# Patient Record
Sex: Female | Born: 1950 | Hispanic: Refuse to answer | Marital: Married | Smoking: Never smoker
Health system: Southern US, Community
[De-identification: ages and names within clinical notes are randomized; demographics above are authoritative.]

## PROBLEM LIST (undated history)

## (undated) DIAGNOSIS — K635 Polyp of colon: Secondary | ICD-10-CM

## (undated) DIAGNOSIS — R079 Chest pain, unspecified: Secondary | ICD-10-CM

## (undated) DIAGNOSIS — R109 Unspecified abdominal pain: Secondary | ICD-10-CM

## (undated) DIAGNOSIS — F419 Anxiety disorder, unspecified: Secondary | ICD-10-CM

## (undated) DIAGNOSIS — G4733 Obstructive sleep apnea (adult) (pediatric): Secondary | ICD-10-CM

## (undated) DIAGNOSIS — R51 Headache: Secondary | ICD-10-CM

## (undated) DIAGNOSIS — R519 Headache, unspecified: Secondary | ICD-10-CM

## (undated) DIAGNOSIS — M8580 Other specified disorders of bone density and structure, unspecified site: Secondary | ICD-10-CM

## (undated) DIAGNOSIS — K219 Gastro-esophageal reflux disease without esophagitis: Secondary | ICD-10-CM

## (undated) DIAGNOSIS — R42 Dizziness and giddiness: Secondary | ICD-10-CM

## (undated) DIAGNOSIS — E785 Hyperlipidemia, unspecified: Secondary | ICD-10-CM

## (undated) DIAGNOSIS — M797 Fibromyalgia: Secondary | ICD-10-CM

## (undated) HISTORY — DX: Headache, unspecified: R51.9

## (undated) HISTORY — PX: SHOULDER ARTHROSCOPY W/ ROTATOR CUFF REPAIR: SHX2400

## (undated) HISTORY — DX: Unspecified abdominal pain: R10.9

## (undated) HISTORY — DX: Dizziness and giddiness: R42

## (undated) HISTORY — DX: Headache: R51

## (undated) HISTORY — DX: Obstructive sleep apnea (adult) (pediatric): G47.33

## (undated) HISTORY — DX: Other specified disorders of bone density and structure, unspecified site: M85.80

## (undated) HISTORY — DX: Anxiety disorder, unspecified: F41.9

## (undated) HISTORY — DX: Chest pain, unspecified: R07.9

## (undated) HISTORY — DX: Hyperlipidemia, unspecified: E78.5

## (undated) HISTORY — DX: Polyp of colon: K63.5

---

## 1998-06-03 ENCOUNTER — Emergency Department (HOSPITAL_COMMUNITY): Admission: EM | Admit: 1998-06-03 | Discharge: 1998-06-03 | Payer: Self-pay | Admitting: Emergency Medicine

## 2010-06-25 ENCOUNTER — Other Ambulatory Visit: Admission: RE | Admit: 2010-06-25 | Discharge: 2010-06-25 | Payer: Self-pay | Admitting: Family Medicine

## 2010-07-06 ENCOUNTER — Encounter: Admission: RE | Admit: 2010-07-06 | Discharge: 2010-07-06 | Payer: Self-pay | Admitting: Family Medicine

## 2010-08-30 ENCOUNTER — Ambulatory Visit (HOSPITAL_COMMUNITY): Admission: RE | Admit: 2010-08-30 | Discharge: 2010-08-31 | Payer: Self-pay | Admitting: Specialist

## 2011-01-31 LAB — CBC
HCT: 42 % (ref 36.0–46.0)
Hemoglobin: 14.2 g/dL (ref 12.0–15.0)
MCV: 84.9 fL (ref 78.0–100.0)
RDW: 13.4 % (ref 11.5–15.5)
WBC: 6.3 10*3/uL (ref 4.0–10.5)

## 2011-01-31 LAB — BASIC METABOLIC PANEL
BUN: 8 mg/dL (ref 6–23)
Chloride: 108 mEq/L (ref 96–112)
Potassium: 4.1 mEq/L (ref 3.5–5.1)

## 2011-01-31 LAB — SURGICAL PCR SCREEN: MRSA, PCR: NEGATIVE

## 2011-08-29 ENCOUNTER — Other Ambulatory Visit: Payer: Self-pay | Admitting: Family Medicine

## 2011-08-29 DIAGNOSIS — Z1231 Encounter for screening mammogram for malignant neoplasm of breast: Secondary | ICD-10-CM

## 2011-09-25 ENCOUNTER — Ambulatory Visit
Admission: RE | Admit: 2011-09-25 | Discharge: 2011-09-25 | Disposition: A | Discharge status: Home / Self Care | Payer: 59 | Source: Ambulatory Visit | Attending: Family Medicine | Admitting: Family Medicine

## 2011-09-25 DIAGNOSIS — Z1231 Encounter for screening mammogram for malignant neoplasm of breast: Secondary | ICD-10-CM

## 2012-10-21 ENCOUNTER — Other Ambulatory Visit (HOSPITAL_BASED_OUTPATIENT_CLINIC_OR_DEPARTMENT_OTHER): Payer: Self-pay | Admitting: Family Medicine

## 2012-10-21 DIAGNOSIS — Z1231 Encounter for screening mammogram for malignant neoplasm of breast: Secondary | ICD-10-CM

## 2012-11-16 ENCOUNTER — Ambulatory Visit (HOSPITAL_BASED_OUTPATIENT_CLINIC_OR_DEPARTMENT_OTHER)
Admission: RE | Admit: 2012-11-16 | Discharge: 2012-11-16 | Disposition: A | Discharge status: Home / Self Care | Payer: 59 | Source: Ambulatory Visit | Attending: Family Medicine | Admitting: Family Medicine

## 2012-11-16 DIAGNOSIS — Z1231 Encounter for screening mammogram for malignant neoplasm of breast: Secondary | ICD-10-CM

## 2013-04-13 ENCOUNTER — Other Ambulatory Visit (HOSPITAL_COMMUNITY)
Admission: RE | Admit: 2013-04-13 | Discharge: 2013-04-13 | Disposition: A | Discharge status: Home / Self Care | Payer: 59 | Source: Ambulatory Visit | Attending: Family Medicine | Admitting: Family Medicine

## 2013-04-13 ENCOUNTER — Other Ambulatory Visit: Payer: Self-pay | Admitting: Family Medicine

## 2013-04-13 DIAGNOSIS — Z124 Encounter for screening for malignant neoplasm of cervix: Secondary | ICD-10-CM | POA: Insufficient documentation

## 2013-08-11 ENCOUNTER — Other Ambulatory Visit: Payer: Self-pay | Admitting: Family Medicine

## 2013-08-11 DIAGNOSIS — R109 Unspecified abdominal pain: Secondary | ICD-10-CM

## 2013-08-12 ENCOUNTER — Ambulatory Visit
Admission: RE | Admit: 2013-08-12 | Discharge: 2013-08-12 | Disposition: A | Discharge status: Home / Self Care | Payer: 59 | Source: Ambulatory Visit | Attending: Family Medicine | Admitting: Family Medicine

## 2013-08-12 DIAGNOSIS — R109 Unspecified abdominal pain: Secondary | ICD-10-CM

## 2013-08-13 ENCOUNTER — Other Ambulatory Visit: Payer: Self-pay | Admitting: Family Medicine

## 2013-08-13 DIAGNOSIS — R109 Unspecified abdominal pain: Secondary | ICD-10-CM

## 2013-08-17 ENCOUNTER — Ambulatory Visit
Admission: RE | Admit: 2013-08-17 | Discharge: 2013-08-17 | Disposition: A | Discharge status: Home / Self Care | Payer: 59 | Source: Ambulatory Visit | Attending: Family Medicine | Admitting: Family Medicine

## 2013-08-17 DIAGNOSIS — R109 Unspecified abdominal pain: Secondary | ICD-10-CM

## 2013-08-17 MED ORDER — IOHEXOL 300 MG/ML  SOLN
100.0000 mL | Freq: Once | INTRAMUSCULAR | Status: AC | PRN
  Administered 2013-08-17: 100 mL via INTRAVENOUS

## 2013-08-20 ENCOUNTER — Other Ambulatory Visit: Payer: Self-pay | Admitting: Gastroenterology

## 2013-08-20 DIAGNOSIS — R109 Unspecified abdominal pain: Secondary | ICD-10-CM

## 2013-08-27 ENCOUNTER — Ambulatory Visit
Admission: RE | Admit: 2013-08-27 | Discharge: 2013-08-27 | Disposition: A | Discharge status: Home / Self Care | Payer: 59 | Source: Ambulatory Visit | Attending: Gastroenterology | Admitting: Gastroenterology

## 2013-08-27 DIAGNOSIS — R109 Unspecified abdominal pain: Secondary | ICD-10-CM

## 2013-09-23 ENCOUNTER — Other Ambulatory Visit: Payer: Self-pay | Admitting: Gastroenterology

## 2013-09-23 DIAGNOSIS — M549 Dorsalgia, unspecified: Secondary | ICD-10-CM

## 2013-10-03 ENCOUNTER — Ambulatory Visit
Admission: RE | Admit: 2013-10-03 | Discharge: 2013-10-03 | Disposition: A | Discharge status: Home / Self Care | Payer: 59 | Source: Ambulatory Visit | Attending: Gastroenterology | Admitting: Gastroenterology

## 2013-10-03 DIAGNOSIS — M549 Dorsalgia, unspecified: Secondary | ICD-10-CM

## 2014-04-19 ENCOUNTER — Other Ambulatory Visit (HOSPITAL_BASED_OUTPATIENT_CLINIC_OR_DEPARTMENT_OTHER): Payer: Self-pay | Admitting: Family Medicine

## 2014-04-19 DIAGNOSIS — Z1231 Encounter for screening mammogram for malignant neoplasm of breast: Secondary | ICD-10-CM

## 2014-04-29 ENCOUNTER — Ambulatory Visit (HOSPITAL_BASED_OUTPATIENT_CLINIC_OR_DEPARTMENT_OTHER)
Admission: RE | Admit: 2014-04-29 | Discharge: 2014-04-29 | Disposition: A | Discharge status: Home / Self Care | Payer: 59 | Source: Ambulatory Visit | Attending: Family Medicine | Admitting: Family Medicine

## 2014-04-29 DIAGNOSIS — Z1231 Encounter for screening mammogram for malignant neoplasm of breast: Secondary | ICD-10-CM | POA: Insufficient documentation

## 2014-08-31 ENCOUNTER — Emergency Department (HOSPITAL_BASED_OUTPATIENT_CLINIC_OR_DEPARTMENT_OTHER): Payer: 59

## 2014-08-31 ENCOUNTER — Encounter (HOSPITAL_BASED_OUTPATIENT_CLINIC_OR_DEPARTMENT_OTHER): Payer: Self-pay | Admitting: Emergency Medicine

## 2014-08-31 ENCOUNTER — Emergency Department (HOSPITAL_BASED_OUTPATIENT_CLINIC_OR_DEPARTMENT_OTHER)
Admission: EM | Admit: 2014-08-31 | Discharge: 2014-08-31 | Disposition: A | Discharge status: Home / Self Care | Payer: 59 | Attending: Emergency Medicine | Admitting: Emergency Medicine

## 2014-08-31 DIAGNOSIS — X58XXXA Exposure to other specified factors, initial encounter: Secondary | ICD-10-CM | POA: Insufficient documentation

## 2014-08-31 DIAGNOSIS — Y9389 Activity, other specified: Secondary | ICD-10-CM | POA: Diagnosis not present

## 2014-08-31 DIAGNOSIS — M25562 Pain in left knee: Secondary | ICD-10-CM

## 2014-08-31 DIAGNOSIS — Z79899 Other long term (current) drug therapy: Secondary | ICD-10-CM | POA: Diagnosis not present

## 2014-08-31 DIAGNOSIS — Z8719 Personal history of other diseases of the digestive system: Secondary | ICD-10-CM | POA: Diagnosis not present

## 2014-08-31 DIAGNOSIS — Y9289 Other specified places as the place of occurrence of the external cause: Secondary | ICD-10-CM | POA: Diagnosis not present

## 2014-08-31 DIAGNOSIS — S8992XA Unspecified injury of left lower leg, initial encounter: Secondary | ICD-10-CM | POA: Insufficient documentation

## 2014-08-31 HISTORY — DX: Gastro-esophageal reflux disease without esophagitis: K21.9

## 2014-08-31 HISTORY — DX: Fibromyalgia: M79.7

## 2014-08-31 MED ORDER — OXYCODONE-ACETAMINOPHEN 5-325 MG PO TABS
2.0000 | ORAL_TABLET | Freq: Once | ORAL | Status: AC
  Administered 2014-08-31: 1 via ORAL
  Filled 2014-08-31: qty 2

## 2014-08-31 MED ORDER — OXYCODONE-ACETAMINOPHEN 5-325 MG PO TABS
1.0000 | ORAL_TABLET | Freq: Once | ORAL | Status: AC
  Administered 2014-08-31: 1 via ORAL
  Filled 2014-08-31: qty 1

## 2014-08-31 MED ORDER — OXYCODONE-ACETAMINOPHEN 5-325 MG PO TABS: 1.0000 | ORAL_TABLET | ORAL | Status: DC | PRN

## 2014-08-31 MED ORDER — CYCLOBENZAPRINE HCL 10 MG PO TABS
5.0000 mg | ORAL_TABLET | Freq: Once | ORAL | Status: AC
  Administered 2014-08-31: 11:00:00 via ORAL
  Filled 2014-08-31: qty 1

## 2014-08-31 MED ORDER — CYCLOBENZAPRINE HCL 10 MG PO TABS: 10.0000 mg | ORAL_TABLET | Freq: Three times a day (TID) | ORAL | Status: DC | PRN

## 2014-08-31 NOTE — ED Notes (Signed)
C/o lt knee pain after getting up from recliner thinks she turned wrong. C/o pain behind knee and does not have full range of motion.

## 2014-08-31 NOTE — Discharge Instructions (Signed)
Knee Pain °Knee pain can be a result of an injury or other medical conditions. Treatment will depend on the cause of your pain. °HOME CARE °· Only take medicine as told by your doctor. °· Keep a healthy weight. Being overweight can make the knee hurt more. °· Stretch before exercising or playing sports. °· If there is constant knee pain, change the way you exercise. Ask your doctor for advice. °· Make sure shoes fit well. Choose the right shoe for the sport or activity. °· Protect your knees. Wear kneepads if needed. °· Rest when you are tired. °GET HELP RIGHT AWAY IF:  °· Your knee pain does not stop. °· Your knee pain does not get better. °· Your knee joint feels hot to the touch. °· You have a fever. °MAKE SURE YOU:  °· Understand these instructions. °· Will watch this condition. °· Will get help right away if you are not doing well or get worse. °Document Released: 01/31/2009 Document Revised: 01/27/2012 Document Reviewed: 01/31/2009 °[employer]® Patient Information ©2015 [employer]. This information is not intended to replace advice given to you by your health care provider. Make sure you discuss any questions you have with your health care provider. ° °

## 2014-09-02 ENCOUNTER — Encounter: Payer: Self-pay | Admitting: Family Medicine

## 2014-09-02 ENCOUNTER — Ambulatory Visit (INDEPENDENT_AMBULATORY_CARE_PROVIDER_SITE_OTHER): Payer: 59 | Admitting: Family Medicine

## 2014-09-02 VITALS — BP 117/81 | HR 106 | Ht 61.0 in | Wt 160.0 lb

## 2014-09-02 DIAGNOSIS — S8992XA Unspecified injury of left lower leg, initial encounter: Secondary | ICD-10-CM

## 2014-09-02 MED ORDER — OXYCODONE-ACETAMINOPHEN 5-325 MG PO TABS: 1.0000 | ORAL_TABLET | Freq: Four times a day (QID) | ORAL | Status: DC | PRN

## 2014-09-02 NOTE — Patient Instructions (Signed)
Your history and exam are consistent with a severe quad strain, less likely a meniscus tear. Both are treated conservatively initially. Wear knee brace regularly when up and walking around. Icing 15 minutes at a time 3-4 times a day. Crutches regularly also but can bear weight on this leg as tolerated. Aleve 2 tabs twice a day with food OR ibuprofen 600mg  three times a day with food for pain and inflammation. Elevate above the level of your heart when possible as needed for swelling. Oxycodone every 6 hours as needed for pain - no driving on this medicine. Follow up with me in 2 weeks.

## 2014-09-02 NOTE — ED Provider Notes (Signed)
CSN: [id number]     Arrival date & time 08/31/14  [id number] History   First MD Initiated Contact with Patient 08/31/14 [phone]     Chief Complaint  Patient presents with  . Knee Pain     (Consider location/radiation/quality/duration/timing/severity/associated sxs/prior Treatment) HPI 63 y.o. Female complaining of left knee pain aftr twisting in recliner last night. States painful behind knee and unable to fully move knee.  NO numbness or tingling distal to knee.  No hip pain or other injury.   Past Medical History  Diagnosis Date  . Fibromyalgia   . GERD (gastroesophageal reflux disease)    History reviewed. No pertinent past surgical history. No family history on file. History  Substance Use Topics  . Smoking status: Never Smoker   . Smokeless tobacco: Not on file  . Alcohol Use: Not on file   OB History   Grav Para Term Preterm Abortions TAB SAB Ect Mult Living                 Review of Systems  All other systems reviewed and are negative.     Allergies  Prednisone and Sulfa antibiotics  Home Medications   Prior to Admission medications   Medication Sig Start Date End Date Taking? Authorizing Provider  escitalopram (LEXAPRO) 5 MG tablet Take 5 mg by mouth daily.   Yes Historical Provider, MD  cyclobenzaprine (FLEXERIL) 10 MG tablet Take 1 tablet (10 mg total) by mouth 3 (three) times daily as needed for muscle spasms. 08/31/14   Hilario Quarryanielle S Hollyann Pablo, MD  oxyCODONE-acetaminophen (PERCOCET/ROXICET) 5-325 MG per tablet Take 1 tablet by mouth every 6 (six) hours as needed for moderate pain or severe pain. 09/02/14   Lenda KelpShane R Hudnall, MD   BP 139/81  Pulse 86  Temp(Src) 98.4 F (36.9 C) (Oral)  Resp 20  SpO2 96% Physical Exam  Nursing note and vitals reviewed. Constitutional: She is oriented to person, place, and time. She appears well-developed and well-nourished. No distress.  HENT:  Head: Normocephalic and atraumatic.  Right Ear: External ear normal.  Left Ear: External  ear normal.  Nose: Nose normal.  Eyes: EOM are normal. Pupils are equal, round, and reactive to light.  Neck: Normal range of motion. Neck supple.  Pulmonary/Chest: Effort normal.  Musculoskeletal: Normal range of motion.  Left knee diffusely ttp medial and posterior aspect. No trauma or deformity noted Knee held in partial flexion.  Arom to 90 degrees, full extension.  No ligamentous laxity noted.   Neurological: She is alert and oriented to person, place, and time. She exhibits normal muscle tone. Coordination normal.  Skin: Skin is warm and dry.  Psychiatric: She has a normal mood and affect. Her behavior is normal. Thought content normal.    ED Course  Procedures (including critical care time) Labs Review Labs Reviewed - No data to display  Imaging Review No results found.   EKG Interpretation None      MDM   Final diagnoses:  Knee pain, acute, left        Hilario Quarryanielle S Markelle Asaro, MD 09/02/14 1202

## 2014-09-06 ENCOUNTER — Encounter: Payer: Self-pay | Admitting: Family Medicine

## 2014-09-06 DIAGNOSIS — S8992XA Unspecified injury of left lower leg, initial encounter: Secondary | ICD-10-CM | POA: Insufficient documentation

## 2014-09-06 NOTE — Assessment & Plan Note (Signed)
mechanism is unusual as she was not weight bearing and just reached to left while seated.  Most likely quad strain given location of pain, mechanism and description of incident.  Meniscus tear a possibility but less likely.  Switch to a knee brace.  Icing, nsaids, elevation.  Crutches regularly for support.  Oxycodone for severe pain.  F/u in 2 weeks for reevaluation.

## 2014-09-06 NOTE — Progress Notes (Signed)
Patient ID: Cyndia DiverMarie C Oviatt, female   DOB: 1951-08-21, 63 y.o.   MRN: [id number]  PCP: Janeece Ageeoss, Caitlin J, [facility]  Subjective:   HPI: Patient is a 63 y.o. female here for left knee pain.  Patient reports she was sitting in a recliner with her feet crossed. She reached to the left and felt pain and like her left knee or distal quad area 'seized up.' No swelling or bruising. Pain an 8/10 level. Went to ED - radiographs negative for fracture, given immobilizer and crutches. No prior injuries to this knee. No catching or locking but hasn't been moving this much.  Past Medical History  Diagnosis Date  . Fibromyalgia   . GERD (gastroesophageal reflux disease)     Current Outpatient Prescriptions on File Prior to Visit  Medication Sig Dispense Refill  . cyclobenzaprine (FLEXERIL) 10 MG tablet Take 1 tablet (10 mg total) by mouth 3 (three) times daily as needed for muscle spasms.  30 tablet  0  . escitalopram (LEXAPRO) 5 MG tablet Take 5 mg by mouth daily.       No current facility-administered medications on file prior to visit.    No past surgical history on file.  Allergies  Allergen Reactions  . Prednisone     Chest pain   . Sulfa Antibiotics     History   Social History  . Marital Status: Married    Spouse Name: N/A    Number of Children: N/A  . Years of Education: N/A   Occupational History  . Not on file.   Social History Main Topics  . Smoking status: Never Smoker   . Smokeless tobacco: Not on file  . Alcohol Use: Not on file  . Drug Use: Not on file  . Sexual Activity: Not on file   Other Topics Concern  . Not on file   Social History Narrative  . No narrative on file    No family history on file.  BP 117/81  Pulse 106  Ht 5\' 1"  (1.549 m)  Wt 160 lb (72.576 kg)  BMI 30.25 kg/m2  Review of Systems: See HPI above.    Objective:  Physical Exam:  Gen: NAD  Left knee: No gross deformity, ecchymoses, effusion. Mild diffuse TTP distal quad, some  at medial and lateral joint lines. ROM 0 - 90 degrees, slow and painful. Negative ant/post drawers. Negative valgus/varus testing. Negative lachmanns. Negative mcmurrays, apleys, patellar apprehension. NV intact distally.    Assessment & Plan:  1. Left knee injury - mechanism is unusual as she was not weight bearing and just reached to left while seated.  Most likely quad strain given location of pain, mechanism and description of incident.  Meniscus tear a possibility but less likely.  Switch to a knee brace.  Icing, nsaids, elevation.  Crutches regularly for support.  Oxycodone for severe pain.  F/u in 2 weeks for reevaluation.

## 2014-09-16 ENCOUNTER — Ambulatory Visit (INDEPENDENT_AMBULATORY_CARE_PROVIDER_SITE_OTHER): Payer: 59 | Admitting: Family Medicine

## 2014-09-16 ENCOUNTER — Encounter: Payer: Self-pay | Admitting: Family Medicine

## 2014-09-16 VITALS — BP 107/76 | HR 89 | Ht 61.0 in | Wt 160.0 lb

## 2014-09-16 DIAGNOSIS — S8992XD Unspecified injury of left lower leg, subsequent encounter: Secondary | ICD-10-CM

## 2014-09-16 NOTE — Patient Instructions (Signed)
Wear knee brace for about 2 more weeks when doing a lot of walking, going out to the store. Do straight leg raises, knee extensions, hamstring curls, hamstring swings 3 sets of 10 once a day for next 4 weeks. Icing, heat, medicines only as needed at this point. Follow up with me in 4 weeks if you're having any problems.

## 2014-09-21 NOTE — Progress Notes (Signed)
Patient ID: Bonnie Alexander, female   DOB: 20-Nov-1950, 63 y.o.   MRN: [id number]  PCP: Janeece Ageeoss, Caitlin J, [facility]  Subjective:   HPI: Patient is a 63 y.o. female here for left knee pain.  10/16: Patient reports she was sitting in a recliner with her feet crossed. She reached to the left and felt pain and like her left knee or distal quad area 'seized up.' No swelling or bruising. Pain an 8/10 level. Went to ED - radiographs negative for fracture, given immobilizer and crutches. No prior injuries to this knee. No catching or locking but hasn't been moving this much.  10/30: Patient reports she feels much better. Wearing brace during the day. No longer needing any medicines. Still icing nightly. Stopped using crutches on Tuesday. Slight soreness, swelling only.  Past Medical History  Diagnosis Date  . Fibromyalgia   . GERD (gastroesophageal reflux disease)     Current Outpatient Prescriptions on File Prior to Visit  Medication Sig Dispense Refill  . budesonide (RHINOCORT AQUA) 32 MCG/ACT nasal spray     . cyclobenzaprine (FLEXERIL) 10 MG tablet Take 1 tablet (10 mg total) by mouth 3 (three) times daily as needed for muscle spasms. 30 tablet 0  . escitalopram (LEXAPRO) 5 MG tablet Take 5 mg by mouth daily.    Marland Kitchen. oxyCODONE-acetaminophen (PERCOCET/ROXICET) 5-325 MG per tablet Take 1 tablet by mouth every 6 (six) hours as needed for moderate pain or severe pain. 40 tablet 0   No current facility-administered medications on file prior to visit.    No past surgical history on file.  Allergies  Allergen Reactions  . Prednisone     Chest pain   . Sulfa Antibiotics     History   Social History  . Marital Status: Married    Spouse Name: N/A    Number of Children: N/A  . Years of Education: N/A   Occupational History  . Not on file.   Social History Main Topics  . Smoking status: Never Smoker   . Smokeless tobacco: Not on file  . Alcohol Use: Not on file  . Drug Use:  Not on file  . Sexual Activity: Not on file   Other Topics Concern  . Not on file   Social History Narrative    No family history on file.  BP 107/76 mmHg  Pulse 89  Ht 5\' 1"  (1.549 m)  Wt 160 lb (72.576 kg)  BMI 30.25 kg/m2  Review of Systems: See HPI above.    Objective:  Physical Exam:  Gen: NAD  Left knee: No gross deformity, ecchymoses, effusion. No TTP distal quad, medial and lateral joint lines. FROM. Negative ant/post drawers. Negative valgus/varus testing. Negative lachmanns. Negative mcmurrays, apleys, patellar apprehension. NV intact distally.    Assessment & Plan:  1. Left knee injury - consistent with quad strain. Much improved at this point.  Continue with knee brace for 2 more weeks.  Home exercises for 4 weeks.  Icing, nsaids if needed.  F/u in 4 weeks if having any problems.

## 2014-09-21 NOTE — Assessment & Plan Note (Signed)
consistent with quad strain. Much improved at this point.  Continue with knee brace for 2 more weeks.  Home exercises for 4 weeks.  Icing, nsaids if needed.  F/u in 4 weeks if having any problems.

## 2015-05-26 ENCOUNTER — Other Ambulatory Visit (HOSPITAL_BASED_OUTPATIENT_CLINIC_OR_DEPARTMENT_OTHER): Payer: Self-pay | Admitting: Family Medicine

## 2015-05-26 DIAGNOSIS — Z1231 Encounter for screening mammogram for malignant neoplasm of breast: Secondary | ICD-10-CM

## 2015-06-09 ENCOUNTER — Ambulatory Visit (HOSPITAL_BASED_OUTPATIENT_CLINIC_OR_DEPARTMENT_OTHER)
Admission: RE | Admit: 2015-06-09 | Discharge: 2015-06-09 | Disposition: A | Discharge status: Home / Self Care | Payer: 59 | Source: Ambulatory Visit | Attending: Family Medicine | Admitting: Family Medicine

## 2015-06-09 DIAGNOSIS — Z1231 Encounter for screening mammogram for malignant neoplasm of breast: Secondary | ICD-10-CM | POA: Diagnosis present

## 2015-07-20 ENCOUNTER — Encounter (INDEPENDENT_AMBULATORY_CARE_PROVIDER_SITE_OTHER): Payer: Self-pay

## 2015-07-20 ENCOUNTER — Telehealth: Payer: Self-pay | Admitting: Neurology

## 2015-07-20 ENCOUNTER — Ambulatory Visit (INDEPENDENT_AMBULATORY_CARE_PROVIDER_SITE_OTHER): Payer: 59 | Admitting: Neurology

## 2015-07-20 ENCOUNTER — Encounter: Payer: Self-pay | Admitting: Neurology

## 2015-07-20 VITALS — BP 110/72 | HR 86 | Resp 20 | Ht 61.5 in | Wt 155.0 lb

## 2015-07-20 DIAGNOSIS — F329 Major depressive disorder, single episode, unspecified: Secondary | ICD-10-CM

## 2015-07-20 DIAGNOSIS — F32A Depression, unspecified: Secondary | ICD-10-CM | POA: Insufficient documentation

## 2015-07-20 DIAGNOSIS — F5102 Adjustment insomnia: Secondary | ICD-10-CM | POA: Diagnosis not present

## 2015-07-20 MED ORDER — SERTRALINE HCL 25 MG PO TABS: 25.0000 mg | ORAL_TABLET | Freq: Every day | ORAL | Status: DC

## 2015-07-20 NOTE — Telephone Encounter (Signed)
Pt was called to inform her about her referral . She stated that she did not feel like she need to go and would like to try the new medication before she made any decisions.

## 2015-07-20 NOTE — Progress Notes (Signed)
SLEEP MEDICINE CLINIC   Provider:  Melvyn Alexander, M D  Referring Provider: Gildardo Cranker, MD Primary Care Physician:  Bonnie Alexander, [facility]  Chief Complaint  Patient presents with  . sleep consult    history of osa, trouble sleeping, rm 11, alone    Chief complaint according to patient : I cannot use CPAP, and have fatigue and untreated apnea.  HPI:  Bonnie Alexander is a 64 y.o. female , seen here as a referral  from Dr. Tenny Alexander for OSA patient, intolerant of CPAP.  The patient is under the impression that I treat Insomnia and fibromyalgia, which is not true.   Bonnie Alexander has moved to [facility] from [place] where she was previously diagnosed with obstructive sleep apnea. However she could not tolerate this suggested CPAP therapy tried several masks, and several pressure settings without benefit to her. She always felt that she felt air of swallowed air. The patient states that she is extremely fatigued now daytime sleepy, she feels tired all the time. The patient lost her mother in April of this year and she reports that throughout her mother's illness she was emotionally not able to adequately respond ,somewhat numbed on Lexapro. She had been on Lexapro for 8 years prescribed by her [place] physician  Bonnie Alexander. She was followed by rheumatology for fibromyalgia . She had an elevated ANA.  Lexapro did not necessarily help her fatigue but with emotional/ mood changes. She quit taking it after a prolonged weaning process. In Dr. Tenny Alexander her new family doctor saw her he obtained a CBC with differential and a comprehensive metabolic panel which I reviewed today these were all normal levels. Coming looks really are just arrived of the only abnormality at 347 mg/dL and she does have a higher LDL and low HDL proportion. Thyroid hormone was normal vitamin D was in the low normal range vitamin B12 was in the mid normal range. Her past medical history is positive for hyperlipidemia,  rotator cuff arthropathy, osteopenia, several allergies, abdominal pain, vertigo, chest pain, headache, colon polyps Bonnie Alexander obstructive sleep apnea and insomnia due to  Fibromyalgia. Her sleep may have been better on lexapro, than now .    Sleep habits are as follows: The patient goes usually to bed by 11 PM, for the last 3 months she has trouble falling asleep and it may take an hour or even longer. Once asleep she often feels that she wakes up from the tinnitus a sound that only she can hear subjectively a ringing but ends with a clapping noise. She has to arise at 7 AM to go to work,  she will  get 6 hours of sleep. She feels her sleep is also impaired by acid reflux disease. Her husband sleeps in a different bedroom now because she snores loudly. She tries to sleep on the side as supine sleep was documented in her sleep study to provoke more apnea. The degree of snoring also is positional dependent. In the morning she needs an alarm to wake up she cannot spontaneously wake in time.   Social history: caffeine up to 10 AM, ETOH ; none, no tobacco use.  She and her husband also raise a grandchild that is currently back in school, her husband was just diagnosed with cancer. Her work is stressful. She is on leave form work.     Sleep medical history and family sleep history: no family history of OSA, possibly one brother of 6 was diagnosed. Marland Kitchen  Review of Systems: Out of a complete 14 system review, the patient complains of only the following symptoms, and all other reviewed systems are negative.   Epworth score 3 , Fatigue severity score 60  , depression score    Social History   Social History  . Marital Status: Married    Spouse Name: N/A  . Number of Children: N/A  . Years of Education: N/A   Occupational History  . Not on file.   Social History Main Topics  . Smoking status: Never Smoker   . Smokeless tobacco: Not on file  . Alcohol Use: 0.0 oz/week    0 Standard drinks or  equivalent per week     Comment: rarely  . Drug Use: Not on file  . Sexual Activity: Not on file   Other Topics Concern  . Not on file   Social History Narrative   Drinks about 1-2 cups of caffeine daily.    Family History  Problem Relation Age of Onset  . Stroke Mother   . Cancer Mother   . Heart disease Father     Past Medical History  Diagnosis Date  . Fibromyalgia   . GERD (gastroesophageal reflux disease)   . Hyperlipidemia   . Osteopenia   . Abdominal pain   . Anxiety   . Vertigo   . Headache   . Chest pain   . Colon polyp   . OSA (obstructive sleep apnea)     Past Surgical History  Procedure Laterality Date  . Shoulder arthroscopy w/ rotator cuff repair      Current Outpatient Prescriptions  Medication Sig Dispense Refill  . budesonide (RHINOCORT AQUA) 32 MCG/ACT nasal spray     . Malic Acid POWD by Does not apply route.    . Multiple Vitamin (MULTIVITAMIN) tablet Take 1 tablet by mouth daily.    . traMADol (ULTRAM) 50 MG tablet Take 50 mg by mouth every 6 (six) hours as needed.     No current facility-administered medications for this visit.    Allergies as of 07/20/2015 - Review Complete 07/20/2015  Allergen Reaction Noted  . Celexa [citalopram hydrobromide]  07/18/2015  . Claritin [loratadine]  07/18/2015  . Omeprazole  07/18/2015  . Prednisone  08/31/2014  . Sulfa antibiotics  08/31/2014    Vitals: BP 110/72 mmHg  Pulse 86  Resp 20  Ht 5' 1.5" (1.562 m)  Wt 155 lb (70.308 kg)  BMI 28.82 kg/m2 Last Weight:  Wt Readings from Last 1 Encounters:  07/20/15 155 lb (70.308 kg)   ZOX:WRUE mass index is 28.82 kg/(m^2).     Last Height:   Ht Readings from Last 1 Encounters:  07/20/15 5' 1.5" (1.562 m)    Physical exam:  General: The patient is awake, alert and appears not in acute distress. The patient is well groomed. Head: Normocephalic, atraumatic. Neck is supple. Mallampati3,  neck circumference: 15. Nasal airflow unrestricted , TMJ  click on the left is evident . Retrognathia is seen, crowded dental status.  Cardiovascular:  Regular rate and rhythm , without  murmurs or carotid bruit, and without distended neck veins. Respiratory: Lungs are clear to auscultation. Skin:  Without evidence of edema, or rash Trunk: BMI is elevated . The patient's posture is "sunken" she appears severely depressed.   Neurologic exam : The patient is awake and alert, oriented to place and time.   Memory subjective  described as impaired- I can not address this in detail today in a 30 minute  sleep clinic visit.    Attention span & concentration ability appears limited  Speech is fluent, without  dysarthria, dysphonia or aphasia.  Mood and affect are appropriate.  Cranial nerves: Pupils are equal and briskly reactive to light. Funduscopic exam without   evidence of pallor or edema. Extraocular movements  in vertical and horizontal planes intact and without nystagmus. Visual fields by finger perimetry are intact. Hearing to finger rub intact.   Facial sensation intact to fine touch.  Facial motor strength is symmetric and tongue and uvula move midline. Shoulder shrug was symmetrical.   Motor exam: Normal tone, muscle bulk and symmetric strength in all extremities. Click over right shoulder  Sensory:  Fine touch, pinprick and vibration were tested in all extremities. Proprioception tested in the upper extremities was normal.  Coordination: Rapid alternating movements in the fingers/hands was normal. Finger-to-nose maneuver  normal without evidence of ataxia, dysmetria or tremor.  Gait and station: Patient walks without assistive device and is able unassisted to climb up to the exam table. Strength within normal limits.  Stance is stable and normal.  Toe and hell stand were tested .Tandem gait is unfragmented. Turns with Steps. Romberg testing is  negative.  Deep tendon reflexes: in the  upper and lower extremities are symmetric and intact.  Babinski maneuver response is  downgoing.  The patient was advised of the nature of the diagnosed sleep disorder , the treatment options and risks for general a health and wellness arising from not treating the condition.  I spent more than 50 minutes of face to face time with the patient. Greater than 50% of time was spent in counseling and coordination of care. We have discussed the diagnosis and differential and I answered the patient's questions.  Thi spatient was slow to report her symptoms and not focussed- directed the interview several time to fibromyalgia. Not sleep problems.  Mrs. Antonopoulos may be a good candidate for a dental device, but her previous sleep doctor in [place] was not convinced that she would be a candidate. I don't have access to her original sleep study and I'm not aware of the severity of apnea, and if she had oxygen desaturations that would indeed make a dental device a less likely option to work for her. I do think that her current chief complaint of insomnia is related to the stressors she is under and that we should address this by going back on the medication but will leave for less anxious and hopefully less depressed as well. Work has been overwhelming for her, her husband's cancer diagnosis and being de facto apparent of a school age child at this time, and the recent passing of her mother all contribute.   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 Causing high fatigue (FSS at 60) and Epworth only at 3. No sleep attacks in daytime.   2) "fibromyalgia " not addressed here, see rheumatology.  3)  Cognitive subjective deficit. Will test with a MOCA in next visit.   4)  Tinnitus since April, onset after flight to [place] for mother's funeral.      Plan:  Treatment plan and additional workup :  Dr Bonnie Alexander to arrange for Fibromyalgia care at rheumatology.  This  patient is clinically significantly depressed and not treated for depression, thus having more insomnia, fatigue and she is tearful.  The patient will need to restart on an antidepression medication - ZOLOFT?        Porfirio Mylar Steele Stracener MD  07/20/2015   CC: Bonnie Cranker, Md [address] [place], [place] [address]

## 2015-07-20 NOTE — Telephone Encounter (Signed)
OK, her choice.

## 2015-07-20 NOTE — Patient Instructions (Signed)
Insomnia Insomnia is frequent trouble falling and/or staying asleep. Insomnia can be a long term problem or a short term problem. Both are common. Insomnia can be a short term problem when the wakefulness is related to a certain stress or worry. Long term insomnia is often related to ongoing stress during waking hours and/or poor sleeping habits. Overtime, sleep deprivation itself can make the problem worse. Every little thing feels more severe because you are overtired and your ability to cope is decreased. CAUSES   Stress, anxiety, and depression.  Poor sleeping habits.  Distractions such as TV in the bedroom.  Naps close to bedtime.  Engaging in emotionally charged conversations before bed.  Technical reading before sleep.  Alcohol and other sedatives. They may make the problem worse. They can hurt normal sleep patterns and normal dream activity.  Stimulants such as caffeine for several hours prior to bedtime.  Pain syndromes and shortness of breath can cause insomnia.  Exercise late at night.  Changing time zones may cause sleeping problems (jet lag). It is sometimes helpful to have someone observe your sleeping patterns. They should look for periods of not breathing during the night (sleep apnea). They should also look to see how long those periods last. If you live alone or observers are uncertain, you can also be observed at a sleep clinic where your sleep patterns will be professionally monitored. Sleep apnea requires a checkup and treatment. Give your caregivers your medical history. Give your caregivers observations your family has made about your sleep.  SYMPTOMS   Not feeling rested in the morning.  Anxiety and restlessness at bedtime.  Difficulty falling and staying asleep. TREATMENT   Your caregiver may prescribe treatment for an underlying medical disorders. Your caregiver can give advice or help if you are using alcohol or other drugs for self-medication. Treatment  of underlying problems will usually eliminate insomnia problems.  Medications can be prescribed for short time use. They are generally not recommended for lengthy use.  Over-the-counter sleep medicines are not recommended for lengthy use. They can be habit forming.  You can promote easier sleeping by making lifestyle changes such as:  Using relaxation techniques that help with breathing and reduce muscle tension.  Exercising earlier in the day.  Changing your diet and the time of your last meal. No night time snacks.  Establish a regular time to go to bed.  Counseling can help with stressful problems and worry.  Soothing music and white noise may be helpful if there are background noises you cannot remove.  Stop tedious detailed work at least one hour before bedtime. HOME CARE INSTRUCTIONS   Keep a diary. Inform your caregiver about your progress. This includes any medication side effects. See your caregiver regularly. Take note of:  Times when you are asleep.  Times when you are awake during the night.  The quality of your sleep.  How you feel the next day. This information will help your caregiver care for you.  Get out of bed if you are still awake after 15 minutes. Read or do some quiet activity. Keep the lights down. Wait until you feel sleepy and go back to bed.  Keep regular sleeping and waking hours. Avoid naps.  Exercise regularly.  Avoid distractions at bedtime. Distractions include watching television or engaging in any intense or detailed activity like attempting to balance the household checkbook.  Develop a bedtime ritual. Keep a familiar routine of bathing, brushing your teeth, climbing into bed at the same   time each night, listening to soothing music. Routines increase the success of falling to sleep faster.  Use relaxation techniques. This can be using breathing and muscle tension release routines. It can also include visualizing peaceful scenes. You can  also help control troubling or intruding thoughts by keeping your mind occupied with boring or repetitive thoughts like the old concept of counting sheep. You can make it more creative like imagining planting one beautiful flower after another in your backyard garden.  During your day, work to eliminate stress. When this is not possible use some of the previous suggestions to help reduce the anxiety that accompanies stressful situations. MAKE SURE YOU:   Understand these instructions.  Will watch your condition.  Will get help right away if you are not doing well or get worse. Document Released: 11/01/2000 Document Revised: 01/27/2012 Document Reviewed: 12/02/2007 Exit[employer] Patient Information 2015 [employer]. This information is not intended to replace advice given to you by your health care provider. Make sure you discuss any questions you have with your health care provider.  

## 2015-08-08 ENCOUNTER — Ambulatory Visit: Payer: 59 | Admitting: Neurology

## 2016-06-20 ENCOUNTER — Other Ambulatory Visit: Payer: Self-pay | Admitting: Family Medicine

## 2016-06-20 ENCOUNTER — Other Ambulatory Visit (HOSPITAL_COMMUNITY)
Admission: RE | Admit: 2016-06-20 | Discharge: 2016-06-20 | Disposition: A | Discharge status: Home / Self Care | Payer: 59 | Source: Ambulatory Visit | Attending: Cardiovascular Disease | Admitting: Cardiovascular Disease

## 2016-06-20 DIAGNOSIS — Z01419 Encounter for gynecological examination (general) (routine) without abnormal findings: Secondary | ICD-10-CM | POA: Diagnosis not present

## 2016-06-24 LAB — CYTOLOGY - PAP

## 2016-08-05 ENCOUNTER — Other Ambulatory Visit (HOSPITAL_BASED_OUTPATIENT_CLINIC_OR_DEPARTMENT_OTHER): Payer: Self-pay | Admitting: Family Medicine

## 2016-08-05 DIAGNOSIS — Z1231 Encounter for screening mammogram for malignant neoplasm of breast: Secondary | ICD-10-CM

## 2016-08-06 ENCOUNTER — Ambulatory Visit (HOSPITAL_BASED_OUTPATIENT_CLINIC_OR_DEPARTMENT_OTHER)
Admission: RE | Admit: 2016-08-06 | Discharge: 2016-08-06 | Disposition: A | Discharge status: Home / Self Care | Payer: 59 | Source: Ambulatory Visit | Attending: Family Medicine | Admitting: Family Medicine

## 2016-08-06 DIAGNOSIS — Z1231 Encounter for screening mammogram for malignant neoplasm of breast: Secondary | ICD-10-CM | POA: Insufficient documentation

## 2017-09-15 ENCOUNTER — Other Ambulatory Visit (HOSPITAL_BASED_OUTPATIENT_CLINIC_OR_DEPARTMENT_OTHER): Payer: Self-pay | Admitting: Family Medicine

## 2017-09-15 DIAGNOSIS — Z1231 Encounter for screening mammogram for malignant neoplasm of breast: Secondary | ICD-10-CM

## 2017-09-20 ENCOUNTER — Encounter (HOSPITAL_BASED_OUTPATIENT_CLINIC_OR_DEPARTMENT_OTHER): Payer: Self-pay

## 2017-09-22 ENCOUNTER — Ambulatory Visit (HOSPITAL_BASED_OUTPATIENT_CLINIC_OR_DEPARTMENT_OTHER)
Admission: RE | Admit: 2017-09-22 | Discharge: 2017-09-22 | Disposition: A | Discharge status: Home / Self Care | Payer: 59 | Source: Ambulatory Visit | Attending: Family Medicine | Admitting: Family Medicine

## 2017-09-22 DIAGNOSIS — Z1231 Encounter for screening mammogram for malignant neoplasm of breast: Secondary | ICD-10-CM | POA: Insufficient documentation

## 2018-08-12 DIAGNOSIS — Z79899 Other long term (current) drug therapy: Secondary | ICD-10-CM | POA: Diagnosis not present

## 2018-08-12 DIAGNOSIS — E781 Pure hyperglyceridemia: Secondary | ICD-10-CM | POA: Diagnosis not present

## 2018-08-12 DIAGNOSIS — E559 Vitamin D deficiency, unspecified: Secondary | ICD-10-CM | POA: Diagnosis not present

## 2018-08-18 DIAGNOSIS — L309 Dermatitis, unspecified: Secondary | ICD-10-CM | POA: Diagnosis not present

## 2018-08-18 DIAGNOSIS — E781 Pure hyperglyceridemia: Secondary | ICD-10-CM | POA: Diagnosis not present

## 2018-08-18 DIAGNOSIS — Z0000 Encounter for general adult medical examination without abnormal findings: Secondary | ICD-10-CM | POA: Diagnosis not present

## 2018-08-18 DIAGNOSIS — M8588 Other specified disorders of bone density and structure, other site: Secondary | ICD-10-CM | POA: Diagnosis not present

## 2018-08-24 DIAGNOSIS — M8588 Other specified disorders of bone density and structure, other site: Secondary | ICD-10-CM | POA: Diagnosis not present

## 2018-09-28 ENCOUNTER — Other Ambulatory Visit (HOSPITAL_BASED_OUTPATIENT_CLINIC_OR_DEPARTMENT_OTHER): Payer: Self-pay | Admitting: Family Medicine

## 2018-09-28 DIAGNOSIS — Z1231 Encounter for screening mammogram for malignant neoplasm of breast: Secondary | ICD-10-CM

## 2018-09-30 DIAGNOSIS — Z23 Encounter for immunization: Secondary | ICD-10-CM | POA: Diagnosis not present

## 2018-10-05 ENCOUNTER — Ambulatory Visit (HOSPITAL_BASED_OUTPATIENT_CLINIC_OR_DEPARTMENT_OTHER)
Admission: RE | Admit: 2018-10-05 | Discharge: 2018-10-05 | Disposition: A | Discharge status: Home / Self Care | Payer: Medicare Other | Source: Ambulatory Visit | Attending: Family Medicine | Admitting: Family Medicine

## 2018-10-05 ENCOUNTER — Encounter (HOSPITAL_BASED_OUTPATIENT_CLINIC_OR_DEPARTMENT_OTHER): Payer: Self-pay

## 2018-10-05 DIAGNOSIS — Z1231 Encounter for screening mammogram for malignant neoplasm of breast: Secondary | ICD-10-CM | POA: Diagnosis not present

## 2018-11-02 DIAGNOSIS — H26492 Other secondary cataract, left eye: Secondary | ICD-10-CM | POA: Diagnosis not present

## 2019-03-17 DIAGNOSIS — J329 Chronic sinusitis, unspecified: Secondary | ICD-10-CM | POA: Diagnosis not present

## 2019-03-17 DIAGNOSIS — F430 Acute stress reaction: Secondary | ICD-10-CM | POA: Diagnosis not present

## 2019-06-02 DIAGNOSIS — M79673 Pain in unspecified foot: Secondary | ICD-10-CM | POA: Diagnosis not present

## 2019-09-28 ENCOUNTER — Other Ambulatory Visit (HOSPITAL_BASED_OUTPATIENT_CLINIC_OR_DEPARTMENT_OTHER): Payer: Self-pay | Admitting: Family Medicine

## 2019-09-28 DIAGNOSIS — Z1231 Encounter for screening mammogram for malignant neoplasm of breast: Secondary | ICD-10-CM

## 2019-09-30 ENCOUNTER — Other Ambulatory Visit (HOSPITAL_BASED_OUTPATIENT_CLINIC_OR_DEPARTMENT_OTHER): Payer: Self-pay | Admitting: Family Medicine

## 2019-09-30 DIAGNOSIS — R609 Edema, unspecified: Secondary | ICD-10-CM | POA: Diagnosis not present

## 2019-09-30 DIAGNOSIS — Z0000 Encounter for general adult medical examination without abnormal findings: Secondary | ICD-10-CM | POA: Diagnosis not present

## 2019-09-30 DIAGNOSIS — E559 Vitamin D deficiency, unspecified: Secondary | ICD-10-CM | POA: Diagnosis not present

## 2019-09-30 DIAGNOSIS — Z23 Encounter for immunization: Secondary | ICD-10-CM | POA: Diagnosis not present

## 2019-09-30 DIAGNOSIS — R1011 Right upper quadrant pain: Secondary | ICD-10-CM

## 2019-09-30 DIAGNOSIS — M797 Fibromyalgia: Secondary | ICD-10-CM | POA: Diagnosis not present

## 2019-09-30 DIAGNOSIS — R300 Dysuria: Secondary | ICD-10-CM | POA: Diagnosis not present

## 2019-09-30 DIAGNOSIS — R1013 Epigastric pain: Secondary | ICD-10-CM

## 2019-09-30 DIAGNOSIS — J309 Allergic rhinitis, unspecified: Secondary | ICD-10-CM | POA: Diagnosis not present

## 2019-09-30 DIAGNOSIS — Z79899 Other long term (current) drug therapy: Secondary | ICD-10-CM | POA: Diagnosis not present

## 2019-09-30 DIAGNOSIS — E7800 Pure hypercholesterolemia, unspecified: Secondary | ICD-10-CM | POA: Diagnosis not present

## 2019-09-30 DIAGNOSIS — R109 Unspecified abdominal pain: Secondary | ICD-10-CM | POA: Diagnosis not present

## 2019-10-07 ENCOUNTER — Other Ambulatory Visit: Payer: Self-pay

## 2019-10-07 ENCOUNTER — Ambulatory Visit (HOSPITAL_BASED_OUTPATIENT_CLINIC_OR_DEPARTMENT_OTHER)
Admission: RE | Admit: 2019-10-07 | Discharge: 2019-10-07 | Disposition: A | Discharge status: Home / Self Care | Payer: Medicare Other | Source: Ambulatory Visit | Attending: Family Medicine | Admitting: Family Medicine

## 2019-10-07 DIAGNOSIS — R1013 Epigastric pain: Secondary | ICD-10-CM

## 2019-10-07 DIAGNOSIS — R1011 Right upper quadrant pain: Secondary | ICD-10-CM | POA: Diagnosis not present

## 2019-10-07 DIAGNOSIS — Z1231 Encounter for screening mammogram for malignant neoplasm of breast: Secondary | ICD-10-CM | POA: Insufficient documentation

## 2019-10-11 ENCOUNTER — Other Ambulatory Visit: Payer: Self-pay | Admitting: Family Medicine

## 2019-10-11 DIAGNOSIS — R928 Other abnormal and inconclusive findings on diagnostic imaging of breast: Secondary | ICD-10-CM

## 2019-10-21 ENCOUNTER — Ambulatory Visit: Payer: Medicare Other

## 2019-10-21 ENCOUNTER — Ambulatory Visit
Admission: RE | Admit: 2019-10-21 | Discharge: 2019-10-21 | Disposition: A | Discharge status: Home / Self Care | Payer: Medicare Other | Source: Ambulatory Visit | Attending: Family Medicine | Admitting: Family Medicine

## 2019-10-21 ENCOUNTER — Other Ambulatory Visit: Payer: Self-pay

## 2019-10-21 DIAGNOSIS — R928 Other abnormal and inconclusive findings on diagnostic imaging of breast: Secondary | ICD-10-CM

## 2019-12-09 DIAGNOSIS — N390 Urinary tract infection, site not specified: Secondary | ICD-10-CM | POA: Diagnosis not present

## 2019-12-09 DIAGNOSIS — Z09 Encounter for follow-up examination after completed treatment for conditions other than malignant neoplasm: Secondary | ICD-10-CM | POA: Diagnosis not present

## 2019-12-09 DIAGNOSIS — R3915 Urgency of urination: Secondary | ICD-10-CM | POA: Diagnosis not present

## 2020-01-10 DIAGNOSIS — N390 Urinary tract infection, site not specified: Secondary | ICD-10-CM | POA: Diagnosis not present

## 2020-01-20 DIAGNOSIS — N3020 Other chronic cystitis without hematuria: Secondary | ICD-10-CM | POA: Diagnosis not present

## 2020-01-20 DIAGNOSIS — N952 Postmenopausal atrophic vaginitis: Secondary | ICD-10-CM | POA: Diagnosis not present

## 2020-02-21 ENCOUNTER — Ambulatory Visit: Payer: Medicare Other | Attending: Internal Medicine

## 2020-02-21 DIAGNOSIS — Z20822 Contact with and (suspected) exposure to covid-19: Secondary | ICD-10-CM | POA: Diagnosis not present

## 2020-02-22 LAB — SARS-COV-2, NAA 2 DAY TAT

## 2020-02-22 LAB — NOVEL CORONAVIRUS, NAA: SARS-CoV-2, NAA: DETECTED — AB

## 2020-02-23 ENCOUNTER — Other Ambulatory Visit: Payer: Self-pay | Admitting: Nurse Practitioner

## 2020-02-23 DIAGNOSIS — U071 COVID-19: Secondary | ICD-10-CM

## 2020-02-23 NOTE — Progress Notes (Signed)
  I connected by phone with Bonnie Alexander on 02/23/2020 at 1:42 PM to discuss the potential use of an new treatment for mild to moderate COVID-19 viral infection in non-hospitalized patients.  This patient is a 69 y.o. female that meets the FDA criteria for Emergency Use Authorization of bamlanivimab/etesevimab or casirivimab/imdevimab.  Has a (+) direct SARS-CoV-2 viral test result  Has mild or moderate COVID-19   Is ? 67 years of age and weighs ? 40 kg  Is NOT hospitalized due to COVID-19  Is NOT requiring oxygen therapy or requiring an increase in baseline oxygen flow rate due to COVID-19  Is within 10 days of symptom onset  Has at least one of the high risk factor(s) for progression to severe COVID-19 and/or hospitalization as defined in EUA.  Specific high risk criteria : >/= 67 yo   I have spoken and communicated the following to the patient or parent/caregiver:  1. FDA has authorized the emergency use of bamlanivimab/etesevimab and casirivimab\imdevimab for the treatment of mild to moderate COVID-19 in adults and pediatric patients with positive results of direct SARS-CoV-2 viral testing who are 16 years of age and older weighing at least 40 kg, and who are at high risk for progressing to severe COVID-19 and/or hospitalization.  2. The significant known and potential risks and benefits of bamlanivimab/etesevimab and casirivimab\imdevimab, and the extent to which such potential risks and benefits are unknown.  3. Information on available alternative treatments and the risks and benefits of those alternatives, including clinical trials.  4. Patients treated with bamlanivimab/etesevimab and casirivimab\imdevimab should continue to self-isolate and use infection control measures (e.g., wear mask, isolate, social distance, avoid sharing personal items, clean and disinfect "high touch" surfaces, and frequent handwashing) according to CDC guidelines.   5. The patient or parent/caregiver  has the option to accept or refuse bamlanivimab/etesevimab or casirivimab\imdevimab .  After reviewing this information with the patient, The patient agreed to proceed with receiving the bamlanimivab infusion and will be provided a copy of the Fact sheet prior to receiving the infusion.Bonnie Alexander 02/23/2020 1:42 PM

## 2020-02-24 MED ORDER — SODIUM CHLORIDE 0.9 % IV SOLN
Freq: Once | INTRAVENOUS | Status: DC
  Filled 2020-02-24: qty 20

## 2020-02-25 ENCOUNTER — Emergency Department (HOSPITAL_COMMUNITY): Payer: Medicare Other

## 2020-02-25 ENCOUNTER — Encounter (HOSPITAL_COMMUNITY): Payer: Self-pay

## 2020-02-25 ENCOUNTER — Ambulatory Visit (HOSPITAL_COMMUNITY)
Admission: RE | Admit: 2020-02-25 | Discharge: 2020-02-25 | Disposition: A | Discharge status: Home / Self Care | Payer: Medicare Other | Source: Ambulatory Visit | Attending: Pulmonary Disease | Admitting: Pulmonary Disease

## 2020-02-25 ENCOUNTER — Other Ambulatory Visit: Payer: Self-pay

## 2020-02-25 ENCOUNTER — Inpatient Hospital Stay (HOSPITAL_COMMUNITY)
Admission: EM | Admit: 2020-02-25 | Discharge: 2020-03-07 | DRG: 177 | Disposition: A | Discharge status: Home Health | Payer: Medicare Other | Source: Ambulatory Visit | Attending: Internal Medicine | Admitting: Internal Medicine

## 2020-02-25 DIAGNOSIS — Z8249 Family history of ischemic heart disease and other diseases of the circulatory system: Secondary | ICD-10-CM | POA: Diagnosis not present

## 2020-02-25 DIAGNOSIS — Z882 Allergy status to sulfonamides status: Secondary | ICD-10-CM

## 2020-02-25 DIAGNOSIS — Z79899 Other long term (current) drug therapy: Secondary | ICD-10-CM

## 2020-02-25 DIAGNOSIS — Z888 Allergy status to other drugs, medicaments and biological substances status: Secondary | ICD-10-CM

## 2020-02-25 DIAGNOSIS — J9601 Acute respiratory failure with hypoxia: Secondary | ICD-10-CM | POA: Diagnosis present

## 2020-02-25 DIAGNOSIS — R0602 Shortness of breath: Secondary | ICD-10-CM | POA: Diagnosis not present

## 2020-02-25 DIAGNOSIS — J939 Pneumothorax, unspecified: Secondary | ICD-10-CM

## 2020-02-25 DIAGNOSIS — G4733 Obstructive sleep apnea (adult) (pediatric): Secondary | ICD-10-CM | POA: Diagnosis present

## 2020-02-25 DIAGNOSIS — R197 Diarrhea, unspecified: Secondary | ICD-10-CM | POA: Diagnosis present

## 2020-02-25 DIAGNOSIS — F419 Anxiety disorder, unspecified: Secondary | ICD-10-CM | POA: Diagnosis present

## 2020-02-25 DIAGNOSIS — I472 Ventricular tachycardia: Secondary | ICD-10-CM | POA: Diagnosis present

## 2020-02-25 DIAGNOSIS — U071 COVID-19: Secondary | ICD-10-CM

## 2020-02-25 DIAGNOSIS — D751 Secondary polycythemia: Secondary | ICD-10-CM | POA: Diagnosis not present

## 2020-02-25 DIAGNOSIS — R0902 Hypoxemia: Secondary | ICD-10-CM | POA: Diagnosis not present

## 2020-02-25 DIAGNOSIS — K219 Gastro-esophageal reflux disease without esophagitis: Secondary | ICD-10-CM | POA: Diagnosis present

## 2020-02-25 DIAGNOSIS — J1282 Pneumonia due to coronavirus disease 2019: Secondary | ICD-10-CM | POA: Diagnosis present

## 2020-02-25 DIAGNOSIS — Z823 Family history of stroke: Secondary | ICD-10-CM

## 2020-02-25 DIAGNOSIS — J9383 Other pneumothorax: Secondary | ICD-10-CM | POA: Diagnosis present

## 2020-02-25 DIAGNOSIS — M8580 Other specified disorders of bone density and structure, unspecified site: Secondary | ICD-10-CM | POA: Diagnosis present

## 2020-02-25 DIAGNOSIS — J9311 Primary spontaneous pneumothorax: Secondary | ICD-10-CM | POA: Diagnosis not present

## 2020-02-25 DIAGNOSIS — Z79891 Long term (current) use of opiate analgesic: Secondary | ICD-10-CM

## 2020-02-25 DIAGNOSIS — M797 Fibromyalgia: Secondary | ICD-10-CM | POA: Diagnosis present

## 2020-02-25 DIAGNOSIS — J9811 Atelectasis: Secondary | ICD-10-CM | POA: Diagnosis not present

## 2020-02-25 DIAGNOSIS — E785 Hyperlipidemia, unspecified: Secondary | ICD-10-CM | POA: Diagnosis present

## 2020-02-25 DIAGNOSIS — Z86010 Personal history of colonic polyps: Secondary | ICD-10-CM

## 2020-02-25 LAB — CBC WITH DIFFERENTIAL/PLATELET
Abs Immature Granulocytes: 0.1 10*3/uL — ABNORMAL HIGH (ref 0.00–0.07)
Basophils Absolute: 0 10*3/uL (ref 0.0–0.1)
Basophils Relative: 0 %
Eosinophils Absolute: 0 10*3/uL (ref 0.0–0.5)
Eosinophils Relative: 0 %
HCT: 45.6 % (ref 36.0–46.0)
Hemoglobin: 15 g/dL (ref 12.0–15.0)
Immature Granulocytes: 1 %
Lymphocytes Relative: 13 %
Lymphs Abs: 1 10*3/uL (ref 0.7–4.0)
MCH: 28.4 pg (ref 26.0–34.0)
MCHC: 32.9 g/dL (ref 30.0–36.0)
MCV: 86.2 fL (ref 80.0–100.0)
Monocytes Absolute: 0.8 10*3/uL (ref 0.1–1.0)
Monocytes Relative: 10 %
Neutro Abs: 5.7 10*3/uL (ref 1.7–7.7)
Neutrophils Relative %: 76 %
Platelets: 165 10*3/uL (ref 150–400)
RBC: 5.29 MIL/uL — ABNORMAL HIGH (ref 3.87–5.11)
RDW: 12.6 % (ref 11.5–15.5)
WBC: 7.5 10*3/uL (ref 4.0–10.5)
nRBC: 0 % (ref 0.0–0.2)

## 2020-02-25 LAB — LACTIC ACID, PLASMA: Lactic Acid, Venous: 1.7 mmol/L (ref 0.5–1.9)

## 2020-02-25 LAB — GLUCOSE, CAPILLARY: Glucose-Capillary: 107 mg/dL — ABNORMAL HIGH (ref 70–99)

## 2020-02-25 LAB — D-DIMER, QUANTITATIVE: D-Dimer, Quant: 1.14 ug/mL-FEU — ABNORMAL HIGH (ref 0.00–0.50)

## 2020-02-25 LAB — COMPREHENSIVE METABOLIC PANEL
ALT: 34 U/L (ref 0–44)
AST: 60 U/L — ABNORMAL HIGH (ref 15–41)
Albumin: 3.3 g/dL — ABNORMAL LOW (ref 3.5–5.0)
Alkaline Phosphatase: 49 U/L (ref 38–126)
Anion gap: 14 (ref 5–15)
BUN: 16 mg/dL (ref 8–23)
CO2: 24 mmol/L (ref 22–32)
Calcium: 8.4 mg/dL — ABNORMAL LOW (ref 8.9–10.3)
Chloride: 98 mmol/L (ref 98–111)
Creatinine, Ser: 0.78 mg/dL (ref 0.44–1.00)
GFR calc Af Amer: >60 mL/min (ref >60)
GFR calc non Af Amer: >60 mL/min (ref >60)
Glucose, Bld: 115 mg/dL — ABNORMAL HIGH (ref 70–99)
Potassium: 3.6 mmol/L (ref 3.5–5.1)
Sodium: 136 mmol/L (ref 135–145)
Total Bilirubin: 1.3 mg/dL — ABNORMAL HIGH (ref 0.3–1.2)
Total Protein: 7 g/dL (ref 6.5–8.1)

## 2020-02-25 LAB — PROCALCITONIN
Procalcitonin: <0.1 ng/mL
Procalcitonin: <0.1 ng/mL

## 2020-02-25 LAB — TRIGLYCERIDES: Triglycerides: 107 mg/dL (ref <150)

## 2020-02-25 LAB — C-REACTIVE PROTEIN: CRP: 10.1 mg/dL — ABNORMAL HIGH (ref <1.0)

## 2020-02-25 LAB — LACTATE DEHYDROGENASE: LDH: 325 U/L — ABNORMAL HIGH (ref 98–192)

## 2020-02-25 LAB — FIBRINOGEN: Fibrinogen: 583 mg/dL — ABNORMAL HIGH (ref 210–475)

## 2020-02-25 LAB — FERRITIN: Ferritin: 1413 ng/mL — ABNORMAL HIGH (ref 11–307)

## 2020-02-25 MED ORDER — SODIUM CHLORIDE 0.9 % IV SOLN: 200.0000 mg | Freq: Once | INTRAVENOUS | Status: DC

## 2020-02-25 MED ORDER — SODIUM CHLORIDE 0.9 % IV SOLN
100.0000 mg | Freq: Every day | INTRAVENOUS | Status: AC
  Administered 2020-02-26 – 2020-02-29 (×4): 100 mg via INTRAVENOUS
  Filled 2020-02-25 (×4): qty 20

## 2020-02-25 MED ORDER — ACETAMINOPHEN 325 MG PO TABS
650.0000 mg | ORAL_TABLET | Freq: Four times a day (QID) | ORAL | Status: DC | PRN
  Administered 2020-03-02: 650 mg via ORAL
  Filled 2020-02-25: qty 2

## 2020-02-25 MED ORDER — SODIUM CHLORIDE 0.9 % IV SOLN: INTRAVENOUS | Status: DC | PRN

## 2020-02-25 MED ORDER — FLUTICASONE PROPIONATE 50 MCG/ACT NA SUSP
1.0000 | Freq: Every day | NASAL | Status: DC
  Administered 2020-02-25 – 2020-03-07 (×11): 1 via NASAL
  Filled 2020-02-25: qty 16

## 2020-02-25 MED ORDER — ADULT MULTIVITAMIN W/MINERALS CH
1.0000 | ORAL_TABLET | Freq: Every day | ORAL | Status: DC
  Administered 2020-02-25: 22:00:00 1 via ORAL
  Filled 2020-02-25 (×2): qty 1

## 2020-02-25 MED ORDER — DIPHENHYDRAMINE HCL 50 MG/ML IJ SOLN: 50.0000 mg | Freq: Once | INTRAMUSCULAR | Status: DC | PRN

## 2020-02-25 MED ORDER — DEXAMETHASONE 4 MG PO TABS
6.0000 mg | ORAL_TABLET | ORAL | Status: DC
  Administered 2020-02-25 – 2020-02-27 (×3): 6 mg via ORAL
  Filled 2020-02-25 (×3): qty 2

## 2020-02-25 MED ORDER — FAMOTIDINE IN NACL 20-0.9 MG/50ML-% IV SOLN: 20.0000 mg | Freq: Once | INTRAVENOUS | Status: DC | PRN

## 2020-02-25 MED ORDER — GUAIFENESIN-DM 100-10 MG/5ML PO SYRP: 10.0000 mL | ORAL_SOLUTION | ORAL | Status: DC | PRN

## 2020-02-25 MED ORDER — IPRATROPIUM-ALBUTEROL 20-100 MCG/ACT IN AERS
1.0000 | INHALATION_SPRAY | Freq: Four times a day (QID) | RESPIRATORY_TRACT | Status: DC
  Administered 2020-02-25 – 2020-03-07 (×42): 1 via RESPIRATORY_TRACT
  Filled 2020-02-25: qty 4

## 2020-02-25 MED ORDER — ALBUTEROL SULFATE HFA 108 (90 BASE) MCG/ACT IN AERS: 2.0000 | INHALATION_SPRAY | Freq: Once | RESPIRATORY_TRACT | Status: DC | PRN

## 2020-02-25 MED ORDER — CALCIUM CARBONATE 1250 (500 CA) MG PO CHEW: 1250.0000 mg | CHEWABLE_TABLET | Freq: Every day | ORAL | Status: DC

## 2020-02-25 MED ORDER — HYDROCOD POLST-CPM POLST ER 10-8 MG/5ML PO SUER
5.0000 mL | Freq: Two times a day (BID) | ORAL | Status: DC | PRN
  Administered 2020-03-01 – 2020-03-06 (×3): 5 mL via ORAL
  Filled 2020-02-25 (×3): qty 5

## 2020-02-25 MED ORDER — SODIUM CHLORIDE 0.9 % IV SOLN: 100.0000 mg | INTRAVENOUS | Status: DC

## 2020-02-25 MED ORDER — SODIUM CHLORIDE 0.9 % IV SOLN: 100.0000 mg | Freq: Every day | INTRAVENOUS | Status: DC

## 2020-02-25 MED ORDER — ENOXAPARIN SODIUM 40 MG/0.4ML ~~LOC~~ SOLN
40.0000 mg | SUBCUTANEOUS | Status: DC
  Administered 2020-02-25 – 2020-03-06 (×11): 40 mg via SUBCUTANEOUS
  Filled 2020-02-25 (×11): qty 0.4

## 2020-02-25 MED ORDER — CALCIUM CARBONATE 1250 (500 CA) MG PO TABS
1.0000 | ORAL_TABLET | Freq: Every day | ORAL | Status: DC
  Administered 2020-02-26 – 2020-03-07 (×11): 500 mg via ORAL
  Filled 2020-02-25 (×13): qty 1

## 2020-02-25 MED ORDER — ZINC SULFATE 220 (50 ZN) MG PO CAPS
220.0000 mg | ORAL_CAPSULE | Freq: Every day | ORAL | Status: DC
  Administered 2020-02-25 – 2020-03-07 (×12): 220 mg via ORAL
  Filled 2020-02-25 (×13): qty 1

## 2020-02-25 MED ORDER — INSULIN ASPART 100 UNIT/ML ~~LOC~~ SOLN
0.0000 [IU] | Freq: Three times a day (TID) | SUBCUTANEOUS | Status: DC
  Administered 2020-02-26: 13:00:00 2 [IU] via SUBCUTANEOUS
  Administered 2020-02-26: 3 [IU] via SUBCUTANEOUS
  Administered 2020-02-27: 2 [IU] via SUBCUTANEOUS
  Administered 2020-02-27 (×2): 1 [IU] via SUBCUTANEOUS
  Administered 2020-02-28: 3 [IU] via SUBCUTANEOUS
  Administered 2020-02-29: 1 [IU] via SUBCUTANEOUS
  Administered 2020-02-29: 2 [IU] via SUBCUTANEOUS
  Administered 2020-02-29: 1 [IU] via SUBCUTANEOUS
  Administered 2020-03-01 – 2020-03-02 (×2): 2 [IU] via SUBCUTANEOUS
  Administered 2020-03-02: 3 [IU] via SUBCUTANEOUS
  Administered 2020-03-03: 2 [IU] via SUBCUTANEOUS
  Administered 2020-03-04 – 2020-03-06 (×5): 1 [IU] via SUBCUTANEOUS

## 2020-02-25 MED ORDER — EPINEPHRINE 0.3 MG/0.3ML IJ SOAJ: 0.3000 mg | Freq: Once | INTRAMUSCULAR | Status: DC | PRN

## 2020-02-25 MED ORDER — TRIMETHOPRIM 100 MG PO TABS
100.0000 mg | ORAL_TABLET | Freq: Every day | ORAL | Status: DC
  Administered 2020-02-25 – 2020-03-07 (×12): 100 mg via ORAL
  Filled 2020-02-25 (×12): qty 1

## 2020-02-25 MED ORDER — METHYLPREDNISOLONE SODIUM SUCC 125 MG IJ SOLR: 125.0000 mg | Freq: Once | INTRAMUSCULAR | Status: DC | PRN

## 2020-02-25 MED ORDER — SODIUM CHLORIDE 0.9 % IV SOLN
100.0000 mg | INTRAVENOUS | Status: AC
  Administered 2020-02-25 (×2): 100 mg via INTRAVENOUS
  Filled 2020-02-25 (×2): qty 20

## 2020-02-25 MED ORDER — ASCORBIC ACID 500 MG PO TABS
500.0000 mg | ORAL_TABLET | Freq: Every day | ORAL | Status: DC
  Administered 2020-02-25 – 2020-03-07 (×12): 500 mg via ORAL
  Filled 2020-02-25 (×13): qty 1

## 2020-02-25 NOTE — ED Triage Notes (Signed)
Pt arrives [facility] from [facility] infusion center. Pt tested positive for COVID on 4/5. Pt has had symptoms for 5 days. [facility] reports low O2 sat upon arrival. Pts RA sat was 88%. Pt denies SHOB. Pt is 95% on 4L.

## 2020-02-25 NOTE — ED Notes (Signed)
Hospitalist to pt bedside.  

## 2020-02-25 NOTE — ED Provider Notes (Signed)
[facility] DEPT Provider Note   CSN: [id number] Arrival date & time: 02/25/20  1154     History Chief Complaint  Patient presents with  . COVID Positive  . Low Oxygen Saturation    Bonnie Alexander is a 69 y.o. female.  Patient is a 69 year old female who presents with worsening Covid symptoms.  She was diagnosed with Covid on April 5.  She been having some rhinorrhea and symptoms that she attributed to allergies for about 10 days.  About 5 days ago her symptoms got worse and that prompted her to get tested for Covid.  She has diffuse myalgias and fatigue.  Some intermittent headaches.  No nausea or vomiting.  She does have some mild diarrhea.  No chest or abdominal pain.  No known fevers.  She was seen at the infusion clinic today to get her first infusion for Covid.  At that point she was noted to have low oxygen saturations of 87 to 88% on room air.  She was transported here by EMS for further evaluation.  She reports some mild shortness of breath.        Past Medical History:  Diagnosis Date  . Abdominal pain   . Anxiety   . Chest pain   . Colon polyp   . Fibromyalgia   . GERD (gastroesophageal reflux disease)   . Headache   . Hyperlipidemia   . OSA (obstructive sleep apnea)   . Osteopenia   . Vertigo     Patient Active Problem List   Diagnosis Date Noted  . Depression determined by examination 07/20/2015  . 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 Left knee injury 09/06/2014    Past Surgical History:  Procedure Laterality Date  . SHOULDER ARTHROSCOPY W/ ROTATOR CUFF REPAIR       OB History   No obstetric history on file.     Family History  Problem Relation Age of Onset  . Stroke Mother   . Cancer Mother   . Heart disease Father     Social History   Tobacco Use  . Smoking status: Never Smoker  . Smokeless tobacco: Never Used  Substance Use Topics  . Alcohol use: Yes    Alcohol/week: 0.0 standard drinks    Comment: rarely  . Drug use: Not on file    Home Medications Prior to Admission medications   Medication Sig Start Date End Date Taking? Authorizing Provider  budesonide (RHINOCORT AQUA) 32 MCG/ACT nasal spray Place 1 spray into both nostrils daily.  08/24/14  Yes [provider]  calcium carbonate (OS-CAL) 1250 (500 Ca) MG chewable tablet Chew 1 tablet by mouth daily. gummy   Yes [provider]  estradiol (ESTRACE) 0.1 MG/GM vaginal cream Place 1 Applicatorful vaginally 2 (two) times a week. 01/20/20  Yes [provider]  Multiple Vitamin (MULTIVITAMIN) tablet Take 1 tablet by mouth daily. Gummy   Yes [provider]  trimethoprim (TRIMPEX) 100 MG tablet Take 100 mg by mouth daily. 01/20/20  Yes [provider]  vitamin C (ASCORBIC ACID) 250 MG tablet Take 250 mg by mouth daily. gummy   Yes [provider]  sertraline (ZOLOFT) 25 MG tablet Take 1 tablet (25 mg total) by mouth daily. Patient not taking: Reported on 02/25/2020 07/20/15   Dohmeier, Asencion Partridge, MD    Allergies    Celexa [citalopram hydrobromide], Claritin [loratadine], Omeprazole, Prednisone, and Sulfa antibiotics  Review of Systems   Review of Systems  Constitutional: Positive  for chills and fatigue. Negative for diaphoresis and fever.  HENT: Positive for congestion and rhinorrhea. Negative for sneezing.   Eyes: Negative.   Respiratory: Positive for cough and shortness of breath. Negative for chest tightness.   Cardiovascular: Negative for chest pain and leg swelling.  Gastrointestinal: Positive for diarrhea. Negative for abdominal pain, blood in stool, nausea and vomiting.  Genitourinary: Negative for difficulty urinating, flank pain, frequency and hematuria.  Musculoskeletal: Positive for back pain and myalgias. Negative for arthralgias.  Skin: Negative for rash.  Neurological: Positive for headaches. Negative for dizziness, speech difficulty, weakness and numbness.     Physical Exam Updated Vital Signs BP 125/63   Pulse (!) 101   Temp 98.6 F (37 C) (Oral)   Resp 16   Wt 70.3 kg   SpO2 94%   BMI 28.81 kg/m   Physical Exam Constitutional:      Appearance: She is well-developed.  HENT:     Head: Normocephalic and atraumatic.  Eyes:     Pupils: Pupils are equal, round, and reactive to light.  Cardiovascular:     Rate and Rhythm: Normal rate and regular rhythm.     Heart sounds: Normal heart sounds.  Pulmonary:     Effort: Pulmonary effort is normal. No respiratory distress.     Breath sounds: Normal breath sounds. No wheezing or rales.  Chest:     Chest wall: No tenderness.  Abdominal:     General: Bowel sounds are normal.     Palpations: Abdomen is soft.     Tenderness: There is no abdominal tenderness. There is no guarding or rebound.  Musculoskeletal:        General: Normal range of motion.     Cervical back: Normal range of motion and neck supple.  Lymphadenopathy:     Cervical: No cervical adenopathy.  Skin:    General: Skin is warm and dry.     Findings: No rash.  Neurological:     Mental Status: She is alert and oriented to person, place, and time.     ED Results / Procedures / Treatments   Labs (all labs ordered are listed, but only abnormal results are displayed) Labs Reviewed  CBC WITH DIFFERENTIAL/PLATELET - Abnormal; Notable for the following components:      Result Value   RBC 5.29 (*)    Abs Immature Granulocytes 0.10 (*)    All other components within normal limits  COMPREHENSIVE METABOLIC PANEL - Abnormal; Notable for the following components:   Glucose, Bld 115 (*)    Calcium 8.4 (*)    Albumin 3.3 (*)    AST 60 (*)    Total Bilirubin 1.3 (*)    All other components within normal limits  D-DIMER, QUANTITATIVE (NOT AT [facility]) - Abnormal; Notable for the following components:   D-Dimer, Quant 1.14 (*)    All other components within normal limits  LACTATE DEHYDROGENASE - Abnormal; Notable for the  following components:   LDH 325 (*)    All other components within normal limits  FERRITIN - Abnormal; Notable for the following components:   Ferritin 1,413 (*)    All other components within normal limits  FIBRINOGEN - Abnormal; Notable for the following components:   Fibrinogen 583 (*)    All other components within normal limits  C-REACTIVE PROTEIN - Abnormal; Notable for the following components:   CRP 10.1 (*)    All other components within normal limits  CULTURE, BLOOD (ROUTINE X 2)  CULTURE, BLOOD (  ROUTINE X 2)  LACTIC ACID, PLASMA  PROCALCITONIN  TRIGLYCERIDES  LACTIC ACID, PLASMA    EKG None  Radiology DG Chest Port 1 View  Result Date: 02/25/2020 CLINICAL DATA:  Shortness of breath and weakness. COVID-19 infection. EXAM: PORTABLE CHEST 1 VIEW COMPARISON:  Radiographs 11/29/2011 and 08/22/2010. FINDINGS: 1248 hours. Lower lung volumes. Allowing for this, the heart size and mediastinal contours are stable. Possible minimal ground-glass opacities peripherally in the mid right lung and at the left lung base. Possible small left pleural effusion. No confluent airspace opacity, edema or pneumothorax. Postsurgical changes are present at the right shoulder. No acute osseous findings. Telemetry leads overlie the chest. IMPRESSION: Possible minimal ground-glass opacities peripherally in the mid right lung and at the left lung base. Findings are nonspecific but could reflect mild viral pneumonia. Possible small left pleural effusion. Electronically Signed   By: Carey Bullocks M.D.   On: 02/25/2020 12:59    Procedures Procedures (including critical care time)  Medications Ordered in ED Medications - No data to display  ED Course  I have reviewed the triage vital signs and the nursing notes.  Pertinent labs & imaging results that were available during my care of the patient were reviewed by me and considered in my medical decision making (see chart for details).    MDM  Rules/Calculators/A&P                      Patient is a 69 year old female who presents with worsening Covid symptoms.  Chest x-ray shows some bilateral opacities.  This was reviewed by me.  She had some mild hypoxia and is currently on oxygen at 2 L/min.  She seems to be doing well on this.  Her labs are reviewed and are consistent with Covid infection.  I spoke with Dr. Karren Burly who will admit the pt for further treatment. Final Clinical Impression(s) / ED Diagnoses Final diagnoses:  COVID-19 virus infection  Hypoxia    Rx / DC Orders ED Discharge Orders    None       Rolan Bucco, MD 02/25/20 1555

## 2020-02-25 NOTE — Progress Notes (Signed)
1100 [facility] for a bamlanivimab-etesevimab infusion. Pt A&Ox4, weak had to use wheelchair to get patient from car to the recliner chair. Pt hypoxic, 87-88% on room air. Pt instructed to sit and take deep breaths, WCTM.   1105 After 5 minutes pt's oxygen still at 90%. EMS non-emergent called for patient to take to the hospital. IV started with NS @ KVO. Pt informed she would need to go the the ED. Amy, RN called Mrs. Swarthout's husband for an update.   1120 EMS here to pick up patient via stretcher. Pt left with cell phone and water bottle.

## 2020-02-25 NOTE — ED Notes (Signed)
ED TO INPATIENT HANDOFF REPORT  ED Nurse Name and Phone #: jon wled  S Name/Age/Gender Bonnie Alexander 69 y.o. female Room/Bed: WA06/WA06  Code Status   Code Status: Not on file  Home/SNF/Other Home Patient oriented to: self, place, time and situation Is this baseline? Yes   Triage Complete: Triage complete  Chief Complaint Acute hypoxemic respiratory failure ([place]) [J96.01]  Triage Note Pt arrives [facility]. Pt tested positive for COVID on 4/5. Pt has had symptoms for 5 days. [place] reports low O2 sat upon arrival. Pts RA sat was 88%. Pt denies SHOB. Pt is 95% on 4L.     Allergies Allergies  Allergen Reactions  . Celexa [Citalopram Hydrobromide]   . Claritin [Loratadine]   . Omeprazole   . Prednisone     Chest pain   . Sulfa Antibiotics     Level of Care/Admitting Diagnosis ED Disposition    ED Disposition Condition [facility]: [place] [[id number]]  Level of Care: Med-Surg [16]  May admit patient to [facility] or [facility] if equivalent level of care is available:: Yes  Covid Evaluation: Confirmed COVID Positive  Diagnosis: Acute hypoxemic respiratory failure [facility]) [[id number]]  Admitting Physician: Truddie Hidden [[id number]]  Attending Physician: Truddie Hidden [[id number]]  Estimated length of stay: 3 - 4 days  Certification:: I certify this patient will need inpatient services for at least 2 midnights       B Medical/Surgery History Past Medical History:  Diagnosis Date  . Abdominal pain   . Anxiety   . Chest pain   . Colon polyp   . Fibromyalgia   . GERD (gastroesophageal reflux disease)   . Headache   . Hyperlipidemia   . OSA (obstructive sleep apnea)   . Osteopenia   . Vertigo    Past Surgical History:  Procedure Laterality Date  . SHOULDER ARTHROSCOPY W/ ROTATOR CUFF REPAIR       A IV Location/Drains/Wounds Patient Lines/Drains/Airways Status   Active Line/Drains/Airways    Name:   Placement date:   Placement time:   Site:   Days:   Peripheral IV 02/25/20 Anterior;Right Forearm   02/25/20    1115    Forearm   less than 1   Peripheral IV 02/25/20 Left Hand   02/25/20    1248    Hand   less than 1          Intake/Output Last 24 hours No intake or output data in the 24 hours ending 02/25/20 1919  Labs/Imaging Results for orders placed or performed during the hospital encounter of 02/25/20 (from the past 48 hour(s))  Lactic acid, plasma     Status: None   Collection Time: 02/25/20 12:38 PM  Result Value Ref Range   Lactic Acid, Venous 1.7 0.5 - 1.9 mmol/L    Comment: Performed at [facility], [place] [address], [place], [place] [address]  CBC WITH DIFFERENTIAL     Status: Abnormal   Collection Time: 02/25/20 12:39 PM  Result Value Ref Range   WBC 7.5 4.0 - 10.5 K/uL   RBC 5.29 (H) 3.87 - 5.11 MIL/uL   Hemoglobin 15.0 12.0 - 15.0 g/dL   HCT 45.6 36.0 - 46.0 %   MCV 86.2 80.0 - 100.0 fL   MCH 28.4 26.0 - 34.0 pg   MCHC 32.9 30.0 - 36.0 g/dL   RDW 12.6 11.5 - 15.5 %   Platelets 165 150 -  400 K/uL   nRBC 0.0 0.0 - 0.2 %   Neutrophils Relative % 76 %   Neutro Abs 5.7 1.7 - 7.7 K/uL   Lymphocytes Relative 13 %   Lymphs Abs 1.0 0.7 - 4.0 K/uL   Monocytes Relative 10 %   Monocytes Absolute 0.8 0.1 - 1.0 K/uL   Eosinophils Relative 0 %   Eosinophils Absolute 0.0 0.0 - 0.5 K/uL   Basophils Relative 0 %   Basophils Absolute 0.0 0.0 - 0.1 K/uL   Immature Granulocytes 1 %   Abs Immature Granulocytes 0.10 (H) 0.00 - 0.07 K/uL    Comment: Performed at [facility], [address] [place], [place] [address]  Comprehensive metabolic panel     Status: Abnormal   Collection Time: 02/25/20 12:39 PM  Result Value Ref Range   Sodium 136 135 - 145 mmol/L   Potassium 3.6 3.5 - 5.1 mmol/L   Chloride 98 98 - 111 mmol/L   CO2 24 22 - 32 mmol/L   Glucose, Bld 115 (H) 70 - 99 mg/dL    Comment: Glucose reference range applies only  to samples taken after fasting for at least 8 hours.   BUN 16 8 - 23 mg/dL   Creatinine, Ser 1.02 0.44 - 1.00 mg/dL   Calcium 8.4 (L) 8.9 - 10.3 mg/dL   Total Protein 7.0 6.5 - 8.1 g/dL   Albumin 3.3 (L) 3.5 - 5.0 g/dL   AST 60 (H) 15 - 41 U/L   ALT 34 0 - 44 U/L   Alkaline Phosphatase 49 38 - 126 U/L   Total Bilirubin 1.3 (H) 0.3 - 1.2 mg/dL   GFR calc non Af Amer >60 >60 mL/min   GFR calc Af Amer >60 >60 mL/min   Anion gap 14 5 - 15    Comment: Performed at [facility], [address] [place], [place] [address]  D-dimer, quantitative     Status: Abnormal   Collection Time: 02/25/20 12:39 PM  Result Value Ref Range   D-Dimer, Quant 1.14 (H) 0.00 - 0.50 ug/mL-FEU    Comment: (NOTE) At the manufacturer cut-off of 0.50 ug/mL FEU, this assay has been documented to exclude PE with a sensitivity and negative predictive value of 97 to 99%.  At this time, this assay has not been approved by the FDA to exclude DVT/VTE. Results should be correlated with clinical presentation. Performed at [facility], [address], [place], [place] [address]   Procalcitonin     Status: None   Collection Time: 02/25/20 12:39 PM  Result Value Ref Range   Procalcitonin <0.10 ng/mL    Comment:        Interpretation: PCT (Procalcitonin) <= 0.5 ng/mL: Systemic infection (sepsis) is not likely. Local bacterial infection is possible. (NOTE)       Sepsis PCT Algorithm           Lower Respiratory Tract                                      Infection PCT Algorithm    ----------------------------     ----------------------------         PCT < 0.25 ng/mL                PCT < 0.10 ng/mL         Strongly encourage  Strongly discourage   discontinuation of antibiotics    initiation of antibiotics    ----------------------------     -----------------------------       PCT 0.25 - 0.50 ng/mL            PCT 0.10 - 0.25 ng/mL               OR       >80% decrease in  PCT            Discourage initiation of                                            antibiotics      Encourage discontinuation           of antibiotics    ----------------------------     -----------------------------         PCT >= 0.50 ng/mL              PCT 0.26 - 0.50 ng/mL               AND        <80% decrease in PCT             Encourage initiation of                                             antibiotics       Encourage continuation           of antibiotics    ----------------------------     -----------------------------        PCT >= 0.50 ng/mL                  PCT > 0.50 ng/mL               AND         increase in PCT                  Strongly encourage                                      initiation of antibiotics    Strongly encourage escalation           of antibiotics                                     -----------------------------                                           PCT <= 0.25 ng/mL                                                 OR                                        >  80% decrease in PCT                                     Discontinue / Do not initiate                                             antibiotics Performed at [facility], [address], [place], [place] [address]   Lactate dehydrogenase     Status: Abnormal   Collection Time: 02/25/20 12:39 PM  Result Value Ref Range   LDH 325 (H) 98 - 192 U/L    Comment: Performed at [facility], [address], [place], [place] [address]  Ferritin     Status: Abnormal   Collection Time: 02/25/20 12:39 PM  Result Value Ref Range   Ferritin 1,413 (H) 11 - 307 ng/mL    Comment: Performed at [facility], [address], [place], [place] [address]  Triglycerides     Status: None   Collection Time: 02/25/20 12:39 PM  Result Value Ref Range   Triglycerides 107 <150 mg/dL    Comment: RESULTS CONFIRMED BY MANUAL DILUTION Performed at [facility], [address] [place], [place] [address]   Fibrinogen     Status: Abnormal   Collection Time: 02/25/20 12:39 PM  Result Value Ref Range   Fibrinogen 583 (H) 210 - 475 mg/dL    Comment: Performed at [facility], [address] [place], [place] [address]  C-reactive protein     Status: Abnormal   Collection Time: 02/25/20 12:39 PM  Result Value Ref Range   CRP 10.1 (H) <1.0 mg/dL    Comment: Performed at [facility], [address] [place], [place] [address]   DG Chest Port 1 View  Result Date: 02/25/2020 CLINICAL DATA:  Shortness of breath and weakness. COVID-19 infection. EXAM: PORTABLE CHEST 1 VIEW COMPARISON:  Radiographs 11/29/2011 and 08/22/2010. FINDINGS: 1248 hours. Lower lung volumes. Allowing for this, the heart size and mediastinal contours are stable. Possible minimal ground-glass opacities peripherally in the mid right lung and at the left lung base. Possible small left pleural effusion. No confluent airspace opacity, edema or pneumothorax. Postsurgical changes are present at the right shoulder. No acute osseous findings. Telemetry leads overlie the chest. IMPRESSION: Possible minimal ground-glass opacities peripherally in the mid right lung and at the left lung base. Findings are nonspecific but could reflect mild viral pneumonia. Possible small left pleural effusion. Electronically Signed   By: Carey Bullocks M.D.   On: 02/25/2020 12:59    Pending Labs Unresulted Labs (From admission, onward)    Start     Ordered   02/25/20 1214  Blood Culture (routine x 2)  BLOOD CULTURE X 2,   STAT     02/25/20 1214   Signed and Held  HIV Antibody (routine testing w rflx)  (HIV Antibody (Routine testing w reflex) panel)  Once,   R     Signed and Held   Signed and Held  ABO/Rh  Once,   R     Signed and Held   Signed and Held  CBC  (enoxaparin (LOVENOX)    CrCl >/= 30 ml/min)  Once,   R    Comments: Baseline for enoxaparin  therapy IF NOT ALREADY DRAWN.  Notify MD if PLT < 100 K.    Signed and Held   Signed and Held  Creatinine, serum  (enoxaparin (LOVENOX)    CrCl >/= 30 ml/min)  Once,   R    Comments: Baseline for enoxaparin therapy IF NOT ALREADY DRAWN.    Signed and Held   Signed and Held  Creatinine, serum  (enoxaparin (LOVENOX)    CrCl >/= 30 ml/min)  Weekly,   R    Comments: while on enoxaparin therapy    Signed and Held   Signed and Held  C-reactive protein  Once,   R     Signed and Held   Signed and Held  Ferritin  Once,   R     Signed and Held   Signed and Held  Procalcitonin  Add-on,   R     Signed and Held   Signed and Held  CBC with Differential/Platelet  Daily,   R     Signed and Held   Signed and Held  Comprehensive metabolic panel  Daily,   R     Signed and Held   Signed and Held  C-reactive protein  Daily,   R     Signed and Held   Signed and Held  D-dimer, quantitative (not at [facility])  Daily,   R     Signed and Held   Signed and Held  Ferritin  Daily,   R     Signed and Held   Signed and Held  Magnesium  Daily,   R     Signed and Held   Signed and Held  Phosphorus  Daily,   R     Signed and Held          Vitals/Pain Today's Vitals   02/25/20 1730 02/25/20 1830 02/25/20 1833 02/25/20 1900  BP: 122/74 119/63 119/63 120/65  Pulse: 99 (!) 102 (!) 102 (!) 102  Resp: (!) 26 (!) 23 (!) 22 (!) 24  Temp:      TempSrc:      SpO2: 97% 94% 94% 95%  Weight:      PainSc:        Isolation Precautions Airborne and Contact precautions  Medications Medications  remdesivir 100 mg in sodium chloride 0.9 % 100 mL IVPB (has no administration in time range)  remdesivir 100 mg in sodium chloride 0.9 % 100 mL IVPB (has no administration in time range)    Mobility walks Low fall risk   Focused Assessments Pulmonary Assessment Handoff:  Lung sounds:   O2 Device: Nasal Cannula O2 Flow Rate (L/min): 4 L/min      R Recommendations: See Admitting Provider Note  Report given  to:   Additional Notes:  [place]

## 2020-02-25 NOTE — ED Notes (Signed)
Pt repositioned in bed. Pt provided with warm blanket and water

## 2020-02-25 NOTE — Plan of Care (Signed)
  Problem: Education: Goal: Knowledge of General Education information will improve Description: Including pain rating scale, medication(s)/side effects and non-pharmacologic comfort measures Outcome: Progressing   Problem: Activity: Goal: Risk for activity intolerance will decrease Outcome: Progressing   

## 2020-02-25 NOTE — ED Notes (Signed)
Pure wick has been placed. Suction set to 45mmHg.  

## 2020-02-25 NOTE — H&P (Signed)
History and Physical    Bonnie Alexander SFK:[id number] DOB: 1951-08-09 DOA: 02/25/2020  PCP: Daisy Floro, MD  Patient coming from: Home  I have personally briefly reviewed patient's old medical records in [facility]  Chief Complaint: Shortness of breath/weakness  HPI: Bonnie Alexander is a 69 y.o. female with medical history significant of OSA, anxiety, fibromyalgia, GERD, hyperlipidemia who presents with approximately 10 days of respiratory symptoms and confirmed diagnosis of Covid.  Patient reports she was in usual state of health up until last Wednesday.  She had family visiting from [place] and it is unclear who had Covid or where the exposure was but now 6 other family members have also tested positive including her husband who has cancer.  Patient reports last week her symptoms were self-limited to allergy type symptoms with postnasal drip.  She reports she did have an odd taste.  She was tested on Monday and subsequently on Wednesday receive the result that she had Covid.  She has been feeling extremely weak.  She has had some fevers and intermittent cough with shortness of breath her predominant symptom is weakness.  She states it feels like a very bad flu.  She was going to have outpatient infusion for Covid but was noted to be hypoxemic thus was sent to the ER for admission.  She has had some diarrhea.  No nausea or vomiting.  No tobacco, alcohol or drug use.   Review of Systems: As per HPI otherwise 10 point review of systems negative.   Past Medical History:  Diagnosis Date  . Abdominal pain   . Anxiety   . Chest pain   . Colon polyp   . Fibromyalgia   . GERD (gastroesophageal reflux disease)   . Headache   . Hyperlipidemia   . OSA (obstructive sleep apnea)   . Osteopenia   . Vertigo     Past Surgical History:  Procedure Laterality Date  . SHOULDER ARTHROSCOPY W/ ROTATOR CUFF REPAIR       reports that she has never smoked. She has never used smokeless  tobacco. She reports current alcohol use. No history on file for drug.  Allergies  Allergen Reactions  . Celexa [Citalopram Hydrobromide]   . Claritin [Loratadine]   . Omeprazole   . Prednisone     Chest pain   . Sulfa Antibiotics     Family History  Problem Relation Age of Onset  . Stroke Mother   . Cancer Mother   . Heart disease Father      Prior to Admission medications   Medication Sig Start Date End Date Taking? Authorizing Provider  budesonide (RHINOCORT AQUA) 32 MCG/ACT nasal spray  08/24/14   [provider]  Malic Acid POWD by Does not apply route.    [provider]  Multiple Vitamin (MULTIVITAMIN) tablet Take 1 tablet by mouth daily.    [provider]  sertraline (ZOLOFT) 25 MG tablet Take 1 tablet (25 mg total) by mouth daily. 07/20/15   Dohmeier, Porfirio Mylar, MD  traMADol (ULTRAM) 50 MG tablet Take 50 mg by mouth every 6 (six) hours as needed.    [provider]  trimethoprim (TRIMPEX) 100 MG tablet Take 100 mg by mouth daily. 01/20/20   [provider]    Physical Exam: Vitals:   02/25/20 1229 02/25/20 1300 02/25/20 1330 02/25/20 1400  BP:  114/65 110/64 116/61  Pulse:  98 96 88  Resp:  17 (!) 23 (!) 22  Temp:  TempSrc:      SpO2:  94% 95% 96%  Weight: 70.3 kg       Vitals:   02/25/20 1229 02/25/20 1300 02/25/20 1330 02/25/20 1400  BP:  114/65 110/64 116/61  Pulse:  98 96 88  Resp:  17 (!) 23 (!) 22  Temp:      TempSrc:      SpO2:  94% 95% 96%  Weight: 70.3 kg        Constitutional: NAD, calm, comfortable Eyes: PERRL, lids and conjunctivae normal ENMT: Mucous membranes are moist. Posterior pharynx clear of any exudate or lesions.Normal dentition.  Neck: normal, supple, no masses, no thyromegaly Respiratory: clear to auscultation bilaterally, no wheezing, or crackles were appreciated; exam done in PPE Cardiovascular: Regular rate and rhythm, no murmurs / rubs / gallops. No extremity edema. 2+ pedal  pulses.  Abdomen: no tenderness, no masses palpated. No hepatosplenomegaly. Bowel sounds positive.  Musculoskeletal: no clubbing / cyanosis. No joint deformity upper and lower extremities. Good ROM, no contractures. Normal muscle tone.  Skin: no rashes, lesions, ulcers. No induration Neurologic: CN 2-12 grossly intact.  Sensation strength grossly intact Psychiatric: Normal judgment and insight. Alert and oriented x 3. Normal mood.     Labs on Admission: I have personally reviewed following labs and imaging studies  CBC: Recent Labs  Lab 02/25/20 1239  WBC 7.5  NEUTROABS 5.7  HGB 15.0  HCT 45.6  MCV 86.2  PLT 165   Basic Metabolic Panel: Recent Labs  Lab 02/25/20 1239  NA 136  K 3.6  CL 98  CO2 24  GLUCOSE 115*  BUN 16  CREATININE 0.78  CALCIUM 8.4*   GFR: CrCl cannot be calculated (Unknown ideal weight.). Liver Function Tests: Recent Labs  Lab 02/25/20 1239  AST 60*  ALT 34  ALKPHOS 49  BILITOT 1.3*  PROT 7.0  ALBUMIN 3.3*   No results for input(s): LIPASE, AMYLASE in the last 168 hours. No results for input(s): AMMONIA in the last 168 hours. Coagulation Profile: No results for input(s): INR, PROTIME in the last 168 hours. Cardiac Enzymes: No results for input(s): CKTOTAL, CKMB, CKMBINDEX, TROPONINI in the last 168 hours. BNP (last 3 results) No results for input(s): PROBNP in the last 8760 hours. HbA1C: No results for input(s): HGBA1C in the last 72 hours. CBG: No results for input(s): GLUCAP in the last 168 hours. Lipid Profile: Recent Labs    02/25/20 1239  TRIG 107   Thyroid Function Tests: No results for input(s): TSH, T4TOTAL, FREET4, T3FREE, THYROIDAB in the last 72 hours. Anemia Panel: No results for input(s): VITAMINB12, FOLATE, FERRITIN, TIBC, IRON, RETICCTPCT in the last 72 hours. Urine analysis: No results found for: COLORURINE, APPEARANCEUR, LABSPEC, PHURINE, GLUCOSEU, HGBUR, BILIRUBINUR, KETONESUR, PROTEINUR, UROBILINOGEN, NITRITE,  LEUKOCYTESUR  Radiological Exams on Admission: DG Chest Port 1 View  Result Date: 02/25/2020 CLINICAL DATA:  Shortness of breath and weakness. COVID-19 infection. EXAM: PORTABLE CHEST 1 VIEW COMPARISON:  Radiographs 11/29/2011 and 08/22/2010. FINDINGS: 1248 hours. Lower lung volumes. Allowing for this, the heart size and mediastinal contours are stable. Possible minimal ground-glass opacities peripherally in the mid right lung and at the left lung base. Possible small left pleural effusion. No confluent airspace opacity, edema or pneumothorax. Postsurgical changes are present at the right shoulder. No acute osseous findings. Telemetry leads overlie the chest. IMPRESSION: Possible minimal ground-glass opacities peripherally in the mid right lung and at the left lung base. Findings are nonspecific but could reflect mild viral pneumonia. Possible  small left pleural effusion. Electronically Signed   By: Richardean Sale M.D.   On: 02/25/2020 12:59    EKG: Independently ordered and pending  Assessment/Plan Bonnie Alexander is a 69 y.o. female with medical history significant of OSA, anxiety, fibromyalgia, GERD, hyperlipidemia who presents with acute hypoxemic respiratory failure secondary to Covid pneumonia  #Acute hypoxemic respiratory failure #COVID-19 pneumonia -Patient hypoxemic requiring up to 4 L -Chest x-ray consistent with findings seen in Covid -Continue remdesivir and dexamethasone and trend inflammatory markers -If sudden rise in oxygen requirement will add on Actemra -Continue vitamins and zinc -Supportive care, inhalers, self proning, spirometry  #Hyperlipidemia #GERD #Fibromyalgia -Not currently on medications   DVT prophylaxis: Lovenox Code Status: Full Family Communication: Husband aware Disposition Plan: Pending Admission status: Inpatient   Truddie Hidden MD [facility] Pager [phone]  If 7PM-7AM, please contact night-coverage [email or web address] Password  [email or web address]  02/25/2020, 2:06 PM

## 2020-02-26 DIAGNOSIS — R0902 Hypoxemia: Secondary | ICD-10-CM

## 2020-02-26 DIAGNOSIS — J9601 Acute respiratory failure with hypoxia: Secondary | ICD-10-CM | POA: Diagnosis not present

## 2020-02-26 DIAGNOSIS — U071 COVID-19: Secondary | ICD-10-CM

## 2020-02-26 LAB — CBC WITH DIFFERENTIAL/PLATELET
Abs Immature Granulocytes: 0.12 10*3/uL — ABNORMAL HIGH (ref 0.00–0.07)
Basophils Absolute: 0 10*3/uL (ref 0.0–0.1)
Basophils Relative: 0 %
Eosinophils Absolute: 0 10*3/uL (ref 0.0–0.5)
Eosinophils Relative: 0 %
HCT: 44.8 % (ref 36.0–46.0)
Hemoglobin: 14.6 g/dL (ref 12.0–15.0)
Immature Granulocytes: 1 %
Lymphocytes Relative: 12 %
Lymphs Abs: 1 10*3/uL (ref 0.7–4.0)
MCH: 27.8 pg (ref 26.0–34.0)
MCHC: 32.6 g/dL (ref 30.0–36.0)
MCV: 85.3 fL (ref 80.0–100.0)
Monocytes Absolute: 0.5 10*3/uL (ref 0.1–1.0)
Monocytes Relative: 6 %
Neutro Abs: 7 10*3/uL (ref 1.7–7.7)
Neutrophils Relative %: 81 %
Platelets: 194 10*3/uL (ref 150–400)
RBC: 5.25 MIL/uL — ABNORMAL HIGH (ref 3.87–5.11)
RDW: 12.6 % (ref 11.5–15.5)
WBC: 8.6 10*3/uL (ref 4.0–10.5)
nRBC: 0 % (ref 0.0–0.2)

## 2020-02-26 LAB — COMPREHENSIVE METABOLIC PANEL
ALT: 34 U/L (ref 0–44)
AST: 58 U/L — ABNORMAL HIGH (ref 15–41)
Albumin: 3.4 g/dL — ABNORMAL LOW (ref 3.5–5.0)
Alkaline Phosphatase: 44 U/L (ref 38–126)
Anion gap: 15 (ref 5–15)
BUN: 16 mg/dL (ref 8–23)
CO2: 21 mmol/L — ABNORMAL LOW (ref 22–32)
Calcium: 8.6 mg/dL — ABNORMAL LOW (ref 8.9–10.3)
Chloride: 99 mmol/L (ref 98–111)
Creatinine, Ser: 0.72 mg/dL (ref 0.44–1.00)
GFR calc Af Amer: >60 mL/min (ref >60)
GFR calc non Af Amer: >60 mL/min (ref >60)
Glucose, Bld: 131 mg/dL — ABNORMAL HIGH (ref 70–99)
Potassium: 3.6 mmol/L (ref 3.5–5.1)
Sodium: 135 mmol/L (ref 135–145)
Total Bilirubin: 1.4 mg/dL — ABNORMAL HIGH (ref 0.3–1.2)
Total Protein: 7.1 g/dL (ref 6.5–8.1)

## 2020-02-26 LAB — C-REACTIVE PROTEIN
CRP: 12.8 mg/dL — ABNORMAL HIGH (ref <1.0)
CRP: 13.3 mg/dL — ABNORMAL HIGH (ref <1.0)

## 2020-02-26 LAB — ABO/RH: ABO/RH(D): O NEG

## 2020-02-26 LAB — GLUCOSE, CAPILLARY
Glucose-Capillary: 122 mg/dL — ABNORMAL HIGH (ref 70–99)
Glucose-Capillary: 179 mg/dL — ABNORMAL HIGH (ref 70–99)
Glucose-Capillary: 203 mg/dL — ABNORMAL HIGH (ref 70–99)
Glucose-Capillary: 162 mg/dL — ABNORMAL HIGH (ref 70–99)

## 2020-02-26 LAB — FERRITIN
Ferritin: 1412 ng/mL — ABNORMAL HIGH (ref 11–307)
Ferritin: 1435 ng/mL — ABNORMAL HIGH (ref 11–307)

## 2020-02-26 LAB — MAGNESIUM: Magnesium: 2.3 mg/dL (ref 1.7–2.4)

## 2020-02-26 LAB — PHOSPHORUS: Phosphorus: 2.7 mg/dL (ref 2.5–4.6)

## 2020-02-26 LAB — HIV ANTIBODY (ROUTINE TESTING W REFLEX): HIV Screen 4th Generation wRfx: NONREACTIVE

## 2020-02-26 LAB — D-DIMER, QUANTITATIVE: D-Dimer, Quant: 1.19 ug/mL-FEU — ABNORMAL HIGH (ref 0.00–0.50)

## 2020-02-26 MED ORDER — ADULT MULTIVITAMIN W/MINERALS CH
1.0000 | ORAL_TABLET | Freq: Every day | ORAL | Status: DC
  Administered 2020-02-26 – 2020-03-07 (×10): 1 via ORAL
  Filled 2020-02-26 (×12): qty 1

## 2020-02-26 NOTE — Progress Notes (Signed)
PROGRESS NOTE    SURAH PELLEY  GEZ:[id number] DOB: October 13, 1951 DOA: 02/25/2020 PCP: Lawerance Cruel, MD   Brief Narrative:  69 y.o. female with medical history significant of OSA, anxiety, fibromyalgia, GERD, hyperlipidemia who presents with approximately 10 days of respiratory symptoms and confirmed diagnosis of Covid.  Patient reports she was in usual state of health up until last Wednesday.  She had family visiting from [place] and it is unclear who had Covid or where the exposure was but now 6 other family members have also tested positive including her husband who has cancer.  Patient reports last week her symptoms were self-limited to allergy type symptoms with postnasal drip.  She reports she did have an odd taste.  She was tested on Monday and subsequently on Wednesday receive the result that she had Covid.  She has been feeling extremely weak.  She has had some fevers and intermittent cough with shortness of breath her predominant symptom is weakness.  She states it feels like a very bad flu.  She was going to have outpatient infusion for Covid but was noted to be hypoxemic thus was sent to the ER for admission.   Assessment & Plan:   Active Problems:   Acute hypoxemic respiratory failure (HCC)    #1 acute hypoxic respiratory failure secondary to Covid pneumonia.  Patient is hypoxic requiring 4 L of oxygen to keep her saturation above 88%. Patient had multiple family members at home who has Covid. Chest x-ray Continue remdesivir and Decadron. Inflammatory markers routine 1435, CRP 12.8, low procalcitonin.  Normal white count.  1.19. Continue vitamin and zinc Encourage her to be proning while in bed Continue incentive spirometry He is tachycardic and tachypneic intermittently. Saturation on room air is 88%.  #2 fibromyalgia not on any medications at home  #3 history of GERD stable  #4 hyperlipidemia not on any medications at home   Estimated body mass index is 27.44  kg/m as calculated from the following:   Height as of this encounter: 5\' 1"  (1.549 m).   Weight as of this encounter: 65.9 kg.  DVT prophylaxis: Lovenox  code Status: Full code Family Communication: Discussed with patient disposition Plan: Patient came from home Plan is to discharge her home when she is medically stable. Barrier to discharge hypoxia and Covid pneumonia  Consultants:   None  Procedures: None Antimicrobials none Subjective: She is resting in bed on 5 L of oxygen does not appear to be in any distress continues to have a cough and have diarrhea no nausea vomiting reported Objective: Vitals:   02/26/20 0957 02/26/20 0958 02/26/20 1001 02/26/20 1007  BP:    121/70  Pulse:    (!) 101  Resp:    (!) 22  Temp:    97.9 F (36.6 C)  TempSrc:    Oral  SpO2: (!) 83% (!) 86% (!) 89% (!) 88%  Weight:      Height:       No intake or output data in the 24 hours ending 02/26/20 1057 Filed Weights   02/25/20 1229 02/25/20 2010  Weight: 70.3 kg 65.9 kg    Examination:  General exam: Appears calm and comfortable  Respiratory system: Scattered rhonchi both lung fields to auscultation. Respiratory effort normal. Cardiovascular system: S1 & S2 heard, RRR. No JVD, murmurs, rubs, gallops or clicks. No pedal edema. Gastrointestinal system: Abdomen is nondistended, soft and nontender. No organomegaly or masses felt. Normal bowel sounds heard. Central nervous system: Alert and oriented.  No focal neurological deficits. Extremities: Symmetric 5 x 5 power. Skin: No rashes, lesions or ulcers Psychiatry: Judgement and insight appear normal. Mood & affect appropriate.     Data Reviewed: I have personally reviewed following labs and imaging studies  CBC: Recent Labs  Lab 02/25/20 1239 02/26/20 0034  WBC 7.5 8.6  NEUTROABS 5.7 7.0  HGB 15.0 14.6  HCT 45.6 44.8  MCV 86.2 85.3  PLT 165 194   Basic Metabolic Panel: Recent Labs  Lab 02/25/20 1239 02/26/20 0034  NA 136 135   K 3.6 3.6  CL 98 99  CO2 24 21*  GLUCOSE 115* 131*  BUN 16 16  CREATININE 0.78 0.72  CALCIUM 8.4* 8.6*  MG  --  2.3  PHOS  --  2.7   GFR: Estimated Creatinine Clearance: 60.1 mL/min (by C-G formula based on SCr of 0.72 mg/dL). Liver Function Tests: Recent Labs  Lab 02/25/20 1239 02/26/20 0034  AST 60* 58*  ALT 34 34  ALKPHOS 49 44  BILITOT 1.3* 1.4*  PROT 7.0 7.1  ALBUMIN 3.3* 3.4*   No results for input(s): LIPASE, AMYLASE in the last 168 hours. No results for input(s): AMMONIA in the last 168 hours. Coagulation Profile: No results for input(s): INR, PROTIME in the last 168 hours. Cardiac Enzymes: No results for input(s): CKTOTAL, CKMB, CKMBINDEX, TROPONINI in the last 168 hours. BNP (last 3 results) No results for input(s): PROBNP in the last 8760 hours. HbA1C: No results for input(s): HGBA1C in the last 72 hours. CBG: Recent Labs  Lab 02/25/20 2014 02/26/20 0820  GLUCAP 107* 122*   Lipid Profile: Recent Labs    02/25/20 1239  TRIG 107   Thyroid Function Tests: No results for input(s): TSH, T4TOTAL, FREET4, T3FREE, THYROIDAB in the last 72 hours. Anemia Panel: Recent Labs    02/25/20 1239 02/26/20 0034  FERRITIN 1,413* 1,435*  1,412*   Sepsis Labs: Recent Labs  Lab 02/25/20 1238 02/25/20 1239 02/25/20 2045  PROCALCITON  --  <0.10 <0.10  LATICACIDVEN 1.7  --   --     Recent Results (from the past 240 hour(s))  Novel Coronavirus, NAA ([facility])     Status: Abnormal   Collection Time: 02/21/20 12:00 AM   Specimen: Nasopharyngeal(NP) swabs in vial transport medium   NASOPHARYNGE  TESTING  Result Value Ref Range Status   SARS-CoV-2, NAA Detected (A) Not Detected Final    Comment: Patients who have a positive COVID-19 test result may now have treatment options. Treatment options are available for patients with mild to moderate symptoms and for hospitalized patients. Visit our website at [email or web address] for resources and  information. This nucleic acid amplification test was developed and its performance characteristics determined by [employer]. Nucleic acid amplification tests include RT-PCR and TMA. This test has not been FDA cleared or approved. This test has been authorized by FDA under an Emergency Use Authorization (EUA). This test is only authorized for the duration of time the declaration that circumstances exist justifying the authorization of the emergency use of in vitro diagnostic tests for detection of SARS-CoV-2 virus and/or diagnosis of COVID-19 infection under section 564(b)(1) of the Act, 21 U.S.C. 469GEX-5(M) (1), unless the authorization is terminated or revoked sooner. When diagnostic testing is negativ e, the possibility of a false negative result should be considered in the context of a patient's recent exposures and the presence of clinical signs and symptoms consistent with COVID-19. An individual without symptoms of COVID-19 and who is not shedding SARS-CoV-2  virus would expect to have a negative (not detected) result in this assay.   SARS-COV-2, NAA 2 DAY TAT     Status: None   Collection Time: 02/21/20 12:00 AM   NASOPHARYNGE  TESTING  Result Value Ref Range Status   SARS-CoV-2, NAA 2 DAY TAT Performed  Final  Blood Culture (routine x 2)     Status: None (Preliminary result)   Collection Time: 02/25/20 12:38 PM   Specimen: BLOOD  Result Value Ref Range Status   Specimen Description BLOOD SITE NOT SPECIFIED  Final   Special Requests   Final    BOTTLES DRAWN AEROBIC AND ANAEROBIC Blood Culture results may not be optimal due to an inadequate volume of blood received in culture bottles Performed at [facility], [address], [place], [place] [address]    Culture PENDING  Incomplete   Report Status PENDING  Incomplete         Radiology Studies: DG Chest Port 1 View  Result Date: 02/25/2020 CLINICAL DATA:  Shortness of breath and weakness.  COVID-19 infection. EXAM: PORTABLE CHEST 1 VIEW COMPARISON:  Radiographs 11/29/2011 and 08/22/2010. FINDINGS: 1248 hours. Lower lung volumes. Allowing for this, the heart size and mediastinal contours are stable. Possible minimal ground-glass opacities peripherally in the mid right lung and at the left lung base. Possible small left pleural effusion. No confluent airspace opacity, edema or pneumothorax. Postsurgical changes are present at the right shoulder. No acute osseous findings. Telemetry leads overlie the chest. IMPRESSION: Possible minimal ground-glass opacities peripherally in the mid right lung and at the left lung base. Findings are nonspecific but could reflect mild viral pneumonia. Possible small left pleural effusion. Electronically Signed   By: Carey Bullocks M.D.   On: 02/25/2020 12:59        Scheduled Meds: . vitamin C  500 mg Oral Daily  . calcium carbonate  1 tablet Oral Q breakfast  . dexamethasone  6 mg Oral Q24H  . enoxaparin (LOVENOX) injection  40 mg Subcutaneous Q24H  . fluticasone  1 spray Each Nare Daily  . insulin aspart  0-9 Units Subcutaneous TID WC  . Ipratropium-Albuterol  1 puff Inhalation Q6H  . multivitamin with minerals  1 tablet Oral Daily  . trimethoprim  100 mg Oral Daily  . zinc sulfate  220 mg Oral Daily   Continuous Infusions: . remdesivir 100 mg in NS 100 mL 100 mg (02/26/20 1026)     LOS: 1 day     Alwyn Ren, MD 02/26/2020, 10:57 AM

## 2020-02-27 DIAGNOSIS — U071 COVID-19: Secondary | ICD-10-CM | POA: Diagnosis not present

## 2020-02-27 DIAGNOSIS — J9601 Acute respiratory failure with hypoxia: Secondary | ICD-10-CM | POA: Diagnosis not present

## 2020-02-27 DIAGNOSIS — R0902 Hypoxemia: Secondary | ICD-10-CM | POA: Diagnosis not present

## 2020-02-27 LAB — CBC WITH DIFFERENTIAL/PLATELET
Abs Immature Granulocytes: 0.17 10*3/uL — ABNORMAL HIGH (ref 0.00–0.07)
Basophils Absolute: 0 10*3/uL (ref 0.0–0.1)
Basophils Relative: 1 %
Eosinophils Absolute: 0 10*3/uL (ref 0.0–0.5)
Eosinophils Relative: 0 %
HCT: 43.3 % (ref 36.0–46.0)
Hemoglobin: 14.1 g/dL (ref 12.0–15.0)
Immature Granulocytes: 2 %
Lymphocytes Relative: 17 %
Lymphs Abs: 1.4 10*3/uL (ref 0.7–4.0)
MCH: 27.8 pg (ref 26.0–34.0)
MCHC: 32.6 g/dL (ref 30.0–36.0)
MCV: 85.2 fL (ref 80.0–100.0)
Monocytes Absolute: 0.6 10*3/uL (ref 0.1–1.0)
Monocytes Relative: 7 %
Neutro Abs: 5.9 10*3/uL (ref 1.7–7.7)
Neutrophils Relative %: 73 %
Platelets: 237 10*3/uL (ref 150–400)
RBC: 5.08 MIL/uL (ref 3.87–5.11)
RDW: 12.4 % (ref 11.5–15.5)
WBC: 8.1 10*3/uL (ref 4.0–10.5)
nRBC: 0 % (ref 0.0–0.2)

## 2020-02-27 LAB — GLUCOSE, CAPILLARY
Glucose-Capillary: 149 mg/dL — ABNORMAL HIGH (ref 70–99)
Glucose-Capillary: 176 mg/dL — ABNORMAL HIGH (ref 70–99)
Glucose-Capillary: 139 mg/dL — ABNORMAL HIGH (ref 70–99)
Glucose-Capillary: 159 mg/dL — ABNORMAL HIGH (ref 70–99)

## 2020-02-27 LAB — COMPREHENSIVE METABOLIC PANEL
ALT: 34 U/L (ref 0–44)
AST: 52 U/L — ABNORMAL HIGH (ref 15–41)
Albumin: 3.2 g/dL — ABNORMAL LOW (ref 3.5–5.0)
Alkaline Phosphatase: 45 U/L (ref 38–126)
Anion gap: 13 (ref 5–15)
BUN: 22 mg/dL (ref 8–23)
CO2: 23 mmol/L (ref 22–32)
Calcium: 9 mg/dL (ref 8.9–10.3)
Chloride: 103 mmol/L (ref 98–111)
Creatinine, Ser: 0.61 mg/dL (ref 0.44–1.00)
GFR calc Af Amer: >60 mL/min (ref >60)
GFR calc non Af Amer: >60 mL/min (ref >60)
Glucose, Bld: 166 mg/dL — ABNORMAL HIGH (ref 70–99)
Potassium: 3.9 mmol/L (ref 3.5–5.1)
Sodium: 139 mmol/L (ref 135–145)
Total Bilirubin: 0.7 mg/dL (ref 0.3–1.2)
Total Protein: 7 g/dL (ref 6.5–8.1)

## 2020-02-27 LAB — FERRITIN: Ferritin: 1885 ng/mL — ABNORMAL HIGH (ref 11–307)

## 2020-02-27 LAB — C-REACTIVE PROTEIN: CRP: 8.5 mg/dL — ABNORMAL HIGH (ref <1.0)

## 2020-02-27 LAB — MAGNESIUM: Magnesium: 2.7 mg/dL — ABNORMAL HIGH (ref 1.7–2.4)

## 2020-02-27 LAB — PHOSPHORUS: Phosphorus: 2.5 mg/dL (ref 2.5–4.6)

## 2020-02-27 LAB — D-DIMER, QUANTITATIVE: D-Dimer, Quant: 0.58 ug/mL-FEU — ABNORMAL HIGH (ref 0.00–0.50)

## 2020-02-27 MED ORDER — TOCILIZUMAB 400 MG/20ML IV SOLN
550.0000 mg | Freq: Once | INTRAVENOUS | Status: AC
  Administered 2020-02-27: 550 mg via INTRAVENOUS
  Filled 2020-02-27: qty 10

## 2020-02-27 NOTE — Progress Notes (Signed)
PROGRESS NOTE    Bonnie Alexander  XBD:[id number] DOB: [birth year] DOA: 02/25/2020 PCP: Daisy Floro, MD   Brief Narrative:68 y.o.femalewith medical history significant ofOSA, anxiety, fibromyalgia, GERD, hyperlipidemia who presents with approximately 10 days of respiratory symptoms and confirmeddiagnosis of Covid. Patient reports she was in usual state of health up until last Wednesday. She had family visiting from [place] and it is unclear who had Covid or where the exposure was but now 6 other family members have also tested positive including her husband who has cancer. Patient reports last week her symptoms were self-limited to allergy type symptoms with postnasal drip. She reports she did have an odd taste. She was tested on Monday and subsequently on Wednesday receive the result that she had Covid. She has been feeling extremely weak. She has had some fevers and intermittent cough with shortness of breath her predominant symptom is weakness. She states it feels like a very bad flu. She was going to have outpatient infusion for Covid but was noted to be hypoxemic thus was sent to the ER for admission.  Assessment & Plan:   Active Problems:   Acute hypoxemic respiratory failure (HCC)   COVID-19 virus infection   Hypoxia  #1 acute hypoxic respiratory failure secondary to Covid pneumonia.  Patient is hypoxic requiring 4 L of oxygen to keep her saturation above 88%. Patient had multiple family members at home who has Covid. Chest x-ray Continue remdesivir and Decadron. Inflammatory markers routine 1435, CRP 12.8, low procalcitonin.  Normal white count.  1.19. Continue vitamin and zinc Encourage her to be proning while in bed Continue incentive spirometry He is tachycardic and tachypneic intermittently. Saturation on room air is 88%. 92% on 5 L.  Titrate to keep sats above 88%. Chest x-ray reviewed by me bilateral groundglass opacities. Will start Actemra since she is  hypoxic with elevated CRP and chest x-ray findings of groundglass opacities.  Patient has no active infection LFTs okay.  #2 fibromyalgia not on any medications at home  #3 history of GERD stable  #4 hyperlipidemia not on any medications at home  Estimated body mass index is 27.44 kg/m as calculated from the following:   Height as of this encounter: 5\' 1"  (1.549 m).   Weight as of this encounter: 65.9 kg.  DVT prophylaxis: Lovenox  code Status: Full code Family Communication: Discussed with patient disposition Plan: Patient came from home Plan is to discharge her home when she is medically stable. Barrier to discharge hypoxia and Covid pneumonia  Consultants:   None  Procedures: None Antimicrobials none  Subjective: She is resting in bed feels better than yesterday however still very weak all over using bedside commode not able to walk to the bathroom due to dyspnea on exertion and generalized weakness  Objective: Vitals:   02/26/20 1007 02/26/20 1334 02/26/20 1802 02/27/20 0503  BP: 121/70 122/75 117/70 113/67  Pulse: (!) 101 89 91 97  Resp: (!) 22 20 20 18   Temp: 97.9 F (36.6 C) 97.8 F (36.6 C) 97.8 F (36.6 C) 98.3 F (36.8 C)  TempSrc: Oral Oral Oral Oral  SpO2: (!) 88% 90% 91% 92%  Weight:      Height:        Intake/Output Summary (Last 24 hours) at 02/27/2020 1256 Last data filed at 02/27/2020 1000 Gross per 24 hour  Intake 770.36 ml  Output --  Net 770.36 ml   Filed Weights   02/25/20 1229 02/25/20 2010  Weight: 70.3 kg 65.9 kg  Examination:  General exam: Appears calm and comfortable  Respiratory system: Decreased breath sounds bilaterally to auscultation. Respiratory effort normal. Cardiovascular system: S1 & S2 heard, RRR. No JVD, murmurs, rubs, gallops or clicks. No pedal edema. Gastrointestinal system: Abdomen is nondistended, soft and nontender. No organomegaly or masses felt. Normal bowel sounds heard. Central nervous system: Alert  and oriented. No focal neurological deficits. Extremities: Symmetric 5 x 5 power. Skin: No rashes, lesions or ulcers Psychiatry: Judgement and insight appear normal. Mood & affect appropriate.     Data Reviewed: I have personally reviewed following labs and imaging studies  CBC: Recent Labs  Lab 02/25/20 1239 02/26/20 0034 02/27/20 0410  WBC 7.5 8.6 8.1  NEUTROABS 5.7 7.0 5.9  HGB 15.0 14.6 14.1  HCT 45.6 44.8 43.3  MCV 86.2 85.3 85.2  PLT 165 194 664   Basic Metabolic Panel: Recent Labs  Lab 02/25/20 1239 02/26/20 0034 02/27/20 0410  NA 136 135 139  K 3.6 3.6 3.9  CL 98 99 103  CO2 24 21* 23  GLUCOSE 115* 131* 166*  BUN 16 16 22   CREATININE 0.78 0.72 0.61  CALCIUM 8.4* 8.6* 9.0  MG  --  2.3 2.7*  PHOS  --  2.7 2.5   GFR: Estimated Creatinine Clearance: 60.1 mL/min (by C-G formula based on SCr of 0.61 mg/dL). Liver Function Tests: Recent Labs  Lab 02/25/20 1239 02/26/20 0034 02/27/20 0410  AST 60* 58* 52*  ALT 34 34 34  ALKPHOS 49 44 45  BILITOT 1.3* 1.4* 0.7  PROT 7.0 7.1 7.0  ALBUMIN 3.3* 3.4* 3.2*   No results for input(s): LIPASE, AMYLASE in the last 168 hours. No results for input(s): AMMONIA in the last 168 hours. Coagulation Profile: No results for input(s): INR, PROTIME in the last 168 hours. Cardiac Enzymes: No results for input(s): CKTOTAL, CKMB, CKMBINDEX, TROPONINI in the last 168 hours. BNP (last 3 results) No results for input(s): PROBNP in the last 8760 hours. HbA1C: No results for input(s): HGBA1C in the last 72 hours. CBG: Recent Labs  Lab 02/26/20 1205 02/26/20 1714 02/26/20 2035 02/27/20 0828 02/27/20 1255  GLUCAP 179* 203* 162* 149* 176*   Lipid Profile: Recent Labs    02/25/20 1239  TRIG 107   Thyroid Function Tests: No results for input(s): TSH, T4TOTAL, FREET4, T3FREE, THYROIDAB in the last 72 hours. Anemia Panel: Recent Labs    02/26/20 0034 02/27/20 0808  FERRITIN 1,435*  1,412* 1,885*   Sepsis  Labs: Recent Labs  Lab 02/25/20 1238 02/25/20 1239 02/25/20 2045  PROCALCITON  --  <0.10 <0.10  LATICACIDVEN 1.7  --   --     Recent Results (from the past 240 hour(s))  Novel Coronavirus, NAA ([facility])     Status: Abnormal   Collection Time: 02/21/20 12:00 AM   Specimen: Nasopharyngeal(NP) swabs in vial transport medium   NASOPHARYNGE  TESTING  Result Value Ref Range Status   SARS-CoV-2, NAA Detected (A) Not Detected Final    Comment: Patients who have a positive COVID-19 test result may now have treatment options. Treatment options are available for patients with mild to moderate symptoms and for hospitalized patients. Visit our website at [email or web address] for resources and information. This nucleic acid amplification test was developed and its performance characteristics determined by [employer]. Nucleic acid amplification tests include RT-PCR and TMA. This test has not been FDA cleared or approved. This test has been authorized by FDA under an Emergency Use Authorization (EUA). This test is only authorized  for the duration of time the declaration that circumstances exist justifying the authorization of the emergency use of in vitro diagnostic tests for detection of SARS-CoV-2 virus and/or diagnosis of COVID-19 infection under section 564(b)(1) of the Act, 21 U.S.C. 177LTJ-0(Z) (1), unless the authorization is terminated or revoked sooner. When diagnostic testing is negativ e, the possibility of a false negative result should be considered in the context of a patient's recent exposures and the presence of clinical signs and symptoms consistent with COVID-19. An individual without symptoms of COVID-19 and who is not shedding SARS-CoV-2 virus would expect to have a negative (not detected) result in this assay.   SARS-COV-2, NAA 2 DAY TAT     Status: None   Collection Time: 02/21/20 12:00 AM   NASOPHARYNGE  TESTING  Result Value Ref Range Status    SARS-CoV-2, NAA 2 DAY TAT Performed  Final  Blood Culture (routine x 2)     Status: None (Preliminary result)   Collection Time: 02/25/20 12:38 PM   Specimen: BLOOD  Result Value Ref Range Status   Specimen Description BLOOD SITE NOT SPECIFIED  Final   Special Requests   Final    BOTTLES DRAWN AEROBIC AND ANAEROBIC Blood Culture results may not be optimal due to an inadequate volume of blood received in culture bottles Performed at [facility], [address], [place], [place] [address]    Culture   Final    NO GROWTH 2 DAYS Performed at [facility], [address], [place], [place] [address]    Report Status PENDING  Incomplete  Blood Culture (routine x 2)     Status: None (Preliminary result)   Collection Time: 02/25/20 12:39 PM   Specimen: BLOOD LEFT HAND  Result Value Ref Range Status   Specimen Description   Final    BLOOD LEFT HAND Performed at [facility], [address] [place], [place] [address]    Special Requests   Final    BOTTLES DRAWN AEROBIC AND ANAEROBIC Blood Culture adequate volume Performed at [facility], [address], [place], [place] [address]    Culture   Final    NO GROWTH 2 DAYS Performed at [facility], [address], [place], [place] [address]    Report Status PENDING  Incomplete         Radiology Studies: No results found.      Scheduled Meds: . vitamin C  500 mg Oral Daily  . calcium carbonate  1 tablet Oral Q breakfast  . dexamethasone  6 mg Oral Q24H  . enoxaparin (LOVENOX) injection  40 mg Subcutaneous Q24H  . fluticasone  1 spray Each Nare Daily  . insulin aspart  0-9 Units Subcutaneous TID WC  . Ipratropium-Albuterol  1 puff Inhalation Q6H  . multivitamin with minerals  1 tablet Oral Daily  . trimethoprim  100 mg Oral Daily  . zinc sulfate  220 mg Oral Daily   Continuous Infusions: . remdesivir 100 mg in NS 100 mL 100 mg (02/26/20 1026)     LOS:  2 days     Alwyn Ren, MD 02/27/2020, 12:56 PM

## 2020-02-27 NOTE — Plan of Care (Signed)
  Problem: Education: Goal: Knowledge of risk factors and measures for prevention of condition will improve Outcome: Progressing   Problem: Coping: Goal: Psychosocial and spiritual needs will be supported Outcome: Progressing   Problem: Health Behavior/Discharge Planning: Goal: Ability to manage health-related needs will improve Outcome: Progressing   Problem: Clinical Measurements: Goal: Diagnostic test results will improve Outcome: Progressing Goal: Respiratory complications will improve Outcome: Progressing   Problem: Pain Managment: Goal: General experience of comfort will improve Outcome: Progressing

## 2020-02-28 DIAGNOSIS — R0902 Hypoxemia: Secondary | ICD-10-CM | POA: Diagnosis not present

## 2020-02-28 DIAGNOSIS — U071 COVID-19: Secondary | ICD-10-CM | POA: Diagnosis not present

## 2020-02-28 DIAGNOSIS — J9601 Acute respiratory failure with hypoxia: Secondary | ICD-10-CM | POA: Diagnosis not present

## 2020-02-28 LAB — CBC WITH DIFFERENTIAL/PLATELET
Abs Immature Granulocytes: 0.26 10*3/uL — ABNORMAL HIGH (ref 0.00–0.07)
Basophils Absolute: 0.1 10*3/uL (ref 0.0–0.1)
Basophils Relative: 1 %
Eosinophils Absolute: 0 10*3/uL (ref 0.0–0.5)
Eosinophils Relative: 0 %
HCT: 42.6 % (ref 36.0–46.0)
Hemoglobin: 14.1 g/dL (ref 12.0–15.0)
Immature Granulocytes: 3 %
Lymphocytes Relative: 13 %
Lymphs Abs: 1.3 10*3/uL (ref 0.7–4.0)
MCH: 28.2 pg (ref 26.0–34.0)
MCHC: 33.1 g/dL (ref 30.0–36.0)
MCV: 85.2 fL (ref 80.0–100.0)
Monocytes Absolute: 0.8 10*3/uL (ref 0.1–1.0)
Monocytes Relative: 7 %
Neutro Abs: 8.1 10*3/uL — ABNORMAL HIGH (ref 1.7–7.7)
Neutrophils Relative %: 76 %
Platelets: 292 10*3/uL (ref 150–400)
RBC: 5 MIL/uL (ref 3.87–5.11)
RDW: 12.5 % (ref 11.5–15.5)
WBC: 10.5 10*3/uL (ref 4.0–10.5)
nRBC: 0 % (ref 0.0–0.2)

## 2020-02-28 LAB — GLUCOSE, CAPILLARY
Glucose-Capillary: 165 mg/dL — ABNORMAL HIGH (ref 70–99)
Glucose-Capillary: 250 mg/dL — ABNORMAL HIGH (ref 70–99)
Glucose-Capillary: 122 mg/dL — ABNORMAL HIGH (ref 70–99)
Glucose-Capillary: 169 mg/dL — ABNORMAL HIGH (ref 70–99)

## 2020-02-28 LAB — C-REACTIVE PROTEIN: CRP: 3.9 mg/dL — ABNORMAL HIGH (ref <1.0)

## 2020-02-28 LAB — COMPREHENSIVE METABOLIC PANEL
ALT: 34 U/L (ref 0–44)
AST: 41 U/L (ref 15–41)
Albumin: 3.1 g/dL — ABNORMAL LOW (ref 3.5–5.0)
Alkaline Phosphatase: 49 U/L (ref 38–126)
Anion gap: 10 (ref 5–15)
BUN: 23 mg/dL (ref 8–23)
CO2: 24 mmol/L (ref 22–32)
Calcium: 8.4 mg/dL — ABNORMAL LOW (ref 8.9–10.3)
Chloride: 103 mmol/L (ref 98–111)
Creatinine, Ser: 0.75 mg/dL (ref 0.44–1.00)
GFR calc Af Amer: >60 mL/min (ref >60)
GFR calc non Af Amer: >60 mL/min (ref >60)
Glucose, Bld: 171 mg/dL — ABNORMAL HIGH (ref 70–99)
Potassium: 3.9 mmol/L (ref 3.5–5.1)
Sodium: 137 mmol/L (ref 135–145)
Total Bilirubin: 0.8 mg/dL (ref 0.3–1.2)
Total Protein: 6.4 g/dL — ABNORMAL LOW (ref 6.5–8.1)

## 2020-02-28 LAB — FERRITIN: Ferritin: 1796 ng/mL — ABNORMAL HIGH (ref 11–307)

## 2020-02-28 LAB — D-DIMER, QUANTITATIVE: D-Dimer, Quant: 0.52 ug/mL-FEU — ABNORMAL HIGH (ref 0.00–0.50)

## 2020-02-28 LAB — MAGNESIUM: Magnesium: 2.4 mg/dL (ref 1.7–2.4)

## 2020-02-28 LAB — PHOSPHORUS: Phosphorus: 2.6 mg/dL (ref 2.5–4.6)

## 2020-02-28 MED ORDER — DEXAMETHASONE 4 MG PO TABS
6.0000 mg | ORAL_TABLET | Freq: Every day | ORAL | Status: DC
  Administered 2020-02-28 – 2020-03-07 (×9): 6 mg via ORAL
  Filled 2020-02-28 (×10): qty 2

## 2020-02-28 NOTE — Care Management Important Message (Signed)
Important Message  Patient Details IM Letter given to Windell Moulding SW Case Manager to present to the Patient Name: Bonnie Alexander MRN: [id number] Date of Birth: 1951-01-01   Medicare Important Message Given:  Yes     Caren Macadam 02/28/2020, 11:41 AM

## 2020-02-28 NOTE — Progress Notes (Addendum)
PROGRESS NOTE    Bonnie Alexander  WEX:[id number] DOB: November 07, 1951 DOA: 02/25/2020 PCP: Daisy Floro, MD   Brief Narrative:68 y.o.femalewith medical history significant ofOSA, anxiety, fibromyalgia, GERD, hyperlipidemia who presents with approximately 10 days of respiratory symptoms and confirmeddiagnosis of Covid. Patient reports she was in usual state of health up until last Wednesday. She had family visiting from [place] and it is unclear who had Covid or where the exposure was but now 6 other family members have also tested positive including her husband who has cancer. Patient reports last week her symptoms were self-limited to allergy type symptoms with postnasal drip. She reports she did have an odd taste. She was tested on Monday and subsequently on Wednesday receive the result that she had Covid. She has been feeling extremely weak. She has had some fevers and intermittent cough with shortness of breath her predominant symptom is weakness. She states it feels like a very bad flu. She was going to have outpatient infusion for Covid but was noted to be hypoxemic thus was sent to the ER for admission.  Assessment & Plan:   Active Problems:   Acute hypoxemic respiratory failure (HCC)   COVID-19 virus infection   Hypoxia   #1 acute hypoxic respiratory failure secondary to Covid pneumonia. Patient is hypoxic requiring 4 L of oxygen to keep her saturation above 88%. Patient had multiple family members at home who has Covid. Continue remdesivir and Decadron. Inflammatory markersroutine 1435, CRP 12.8, low procalcitonin. Normal white count. 1.19. Continue vitamin and zinc Encourage her to be proning while in bed Continue incentive spirometry He is tachycardic and tachypneic intermittently. Saturation on room air is 88%. 92% on 5 L.  Titrate to keep sats above 88%.  90% on 3 L. Chest x-ray reviewed by me bilateral groundglass opacities. Status post Actemra 02/27/2020 Out  of bed PT consult  #2 fibromyalgia not on any medications at home  #3 history of GERD stable  #4 hyperlipidemia not on any medications at home    Estimated body mass index is 27.44 kg/m as calculated from the following:   Height as of this encounter: 5\' 1"  (1.549 m).   Weight as of this encounter: 65.9 kg.  DVT prophylaxis:Lovenox  code Status:Full code Family Communication:Discussed with patient disposition Plan:Patient came from home Plan is to discharge her home when she is medically stable. Barrier to discharge hypoxia and Covid pneumonia  Consultants:  None  Procedures:None Antimicrobialsnone   Subjective:  Patient is resting in bed feels breathing is better than yesterday Continue to feel weak all over Objective: Vitals:   02/27/20 2048 02/28/20 0423 02/28/20 0858 02/28/20 1207  BP: 116/74 122/72  124/69  Pulse: 99 78  95  Resp: 18 20  17   Temp: 98 F (36.7 C) 97.9 F (36.6 C)  98.2 F (36.8 C)  TempSrc: Oral Oral  Oral  SpO2: (!) 89% 90% 91% 90%  Weight:      Height:        Intake/Output Summary (Last 24 hours) at 02/28/2020 1348 Last data filed at 02/28/2020 0857 Gross per 24 hour  Intake 340 ml  Output --  Net 340 ml   Filed Weights   02/25/20 1229 02/25/20 2010  Weight: 70.3 kg 65.9 kg    Examination:  General exam: Appears calm and comfortable  Respiratory system: Few scattered rhonchi to auscultation. Respiratory effort normal. Cardiovascular system: S1 & S2 heard, RRR. No JVD, murmurs, rubs, gallops or clicks. No pedal edema. Gastrointestinal system:  Abdomen is nondistended, soft and nontender. No organomegaly or masses felt. Normal bowel sounds heard. Central nervous system: Alert and oriented. No focal neurological deficits. Extremities: Symmetric 5 x 5 power. Skin: No rashes, lesions or ulcers Psychiatry: Judgement and insight appear normal. Mood & affect appropriate.     Data Reviewed: I have personally reviewed  following labs and imaging studies  CBC: Recent Labs  Lab 02/25/20 1239 02/26/20 0034 02/27/20 0410 02/28/20 0402  WBC 7.5 8.6 8.1 10.5  NEUTROABS 5.7 7.0 5.9 8.1*  HGB 15.0 14.6 14.1 14.1  HCT 45.6 44.8 43.3 42.6  MCV 86.2 85.3 85.2 85.2  PLT 165 194 237 009   Basic Metabolic Panel: Recent Labs  Lab 02/25/20 1239 02/26/20 0034 02/27/20 0410 02/28/20 0402  NA 136 135 139 137  K 3.6 3.6 3.9 3.9  CL 98 99 103 103  CO2 24 21* 23 24  GLUCOSE 115* 131* 166* 171*  BUN 16 16 22 23   CREATININE 0.78 0.72 0.61 0.75  CALCIUM 8.4* 8.6* 9.0 8.4*  MG  --  2.3 2.7* 2.4  PHOS  --  2.7 2.5 2.6   GFR: Estimated Creatinine Clearance: 60.1 mL/min (by C-G formula based on SCr of 0.75 mg/dL). Liver Function Tests: Recent Labs  Lab 02/25/20 1239 02/26/20 0034 02/27/20 0410 02/28/20 0402  AST 60* 58* 52* 41  ALT 34 34 34 34  ALKPHOS 49 44 45 49  BILITOT 1.3* 1.4* 0.7 0.8  PROT 7.0 7.1 7.0 6.4*  ALBUMIN 3.3* 3.4* 3.2* 3.1*   No results for input(s): LIPASE, AMYLASE in the last 168 hours. No results for input(s): AMMONIA in the last 168 hours. Coagulation Profile: No results for input(s): INR, PROTIME in the last 168 hours. Cardiac Enzymes: No results for input(s): CKTOTAL, CKMB, CKMBINDEX, TROPONINI in the last 168 hours. BNP (last 3 results) No results for input(s): PROBNP in the last 8760 hours. HbA1C: No results for input(s): HGBA1C in the last 72 hours. CBG: Recent Labs  Lab 02/27/20 1255 02/27/20 1614 02/27/20 2113 02/28/20 0802 02/28/20 1159  GLUCAP 176* 139* 159* 165* 250*   Lipid Profile: No results for input(s): CHOL, HDL, LDLCALC, TRIG, CHOLHDL, LDLDIRECT in the last 72 hours. Thyroid Function Tests: No results for input(s): TSH, T4TOTAL, FREET4, T3FREE, THYROIDAB in the last 72 hours. Anemia Panel: Recent Labs    02/27/20 0808 02/28/20 0402  FERRITIN 1,885* 1,796*   Sepsis Labs: Recent Labs  Lab 02/25/20 1238 02/25/20 1239 02/25/20 2045    PROCALCITON  --  <0.10 <0.10  LATICACIDVEN 1.7  --   --     Recent Results (from the past 240 hour(s))  Novel Coronavirus, NAA ([facility])     Status: Abnormal   Collection Time: 02/21/20 12:00 AM   Specimen: Nasopharyngeal(NP) swabs in vial transport medium   NASOPHARYNGE  TESTING  Result Value Ref Range Status   SARS-CoV-2, NAA Detected (A) Not Detected Final    Comment: Patients who have a positive COVID-19 test result may now have treatment options. Treatment options are available for patients with mild to moderate symptoms and for hospitalized patients. Visit our website at [email or web address] for resources and information. This nucleic acid amplification test was developed and its performance characteristics determined by [employer]. Nucleic acid amplification tests include RT-PCR and TMA. This test has not been FDA cleared or approved. This test has been authorized by FDA under an Emergency Use Authorization (EUA). This test is only authorized for the duration of time the declaration that circumstances  exist justifying the authorization of the emergency use of in vitro diagnostic tests for detection of SARS-CoV-2 virus and/or diagnosis of COVID-19 infection under section 564(b)(1) of the Act, 21 U.S.C. 144RXV-4(M) (1), unless the authorization is terminated or revoked sooner. When diagnostic testing is negativ e, the possibility of a false negative result should be considered in the context of a patient's recent exposures and the presence of clinical signs and symptoms consistent with COVID-19. An individual without symptoms of COVID-19 and who is not shedding SARS-CoV-2 virus would expect to have a negative (not detected) result in this assay.   SARS-COV-2, NAA 2 DAY TAT     Status: None   Collection Time: 02/21/20 12:00 AM   NASOPHARYNGE  TESTING  Result Value Ref Range Status   SARS-CoV-2, NAA 2 DAY TAT Performed  Final  Blood Culture (routine x  2)     Status: None (Preliminary result)   Collection Time: 02/25/20 12:38 PM   Specimen: BLOOD  Result Value Ref Range Status   Specimen Description BLOOD SITE NOT SPECIFIED  Final   Special Requests   Final    BOTTLES DRAWN AEROBIC AND ANAEROBIC Blood Culture results may not be optimal due to an inadequate volume of blood received in culture bottles Performed at [facility], [address], [place], [place] [address]    Culture   Final    NO GROWTH 3 DAYS Performed at [facility], [address] [place], [place] [address]    Report Status PENDING  Incomplete  Blood Culture (routine x 2)     Status: None (Preliminary result)   Collection Time: 02/25/20 12:39 PM   Specimen: BLOOD LEFT HAND  Result Value Ref Range Status   Specimen Description   Final    BLOOD LEFT HAND Performed at [facility], [address], [place], [place] [address]    Special Requests   Final    BOTTLES DRAWN AEROBIC AND ANAEROBIC Blood Culture adequate volume Performed at [facility], [address] [place], [place] [address]    Culture   Final    NO GROWTH 3 DAYS Performed at [facility], [address] [place], [place] [address]    Report Status PENDING  Incomplete         Radiology Studies: No results found.      Scheduled Meds: . vitamin C  500 mg Oral Daily  . calcium carbonate  1 tablet Oral Q breakfast  . dexamethasone  6 mg Oral Daily  . enoxaparin (LOVENOX) injection  40 mg Subcutaneous Q24H  . fluticasone  1 spray Each Nare Daily  . insulin aspart  0-9 Units Subcutaneous TID WC  . Ipratropium-Albuterol  1 puff Inhalation Q6H  . multivitamin with minerals  1 tablet Oral Daily  . trimethoprim  100 mg Oral Daily  . zinc sulfate  220 mg Oral Daily   Continuous Infusions: . remdesivir 100 mg in NS 100 mL 100 mg (02/28/20 0855)     LOS: 3 days     Alwyn Ren md 02/28/2020, 1:48 PM

## 2020-02-29 DIAGNOSIS — J9601 Acute respiratory failure with hypoxia: Secondary | ICD-10-CM | POA: Diagnosis not present

## 2020-02-29 DIAGNOSIS — R0902 Hypoxemia: Secondary | ICD-10-CM | POA: Diagnosis not present

## 2020-02-29 DIAGNOSIS — U071 COVID-19: Secondary | ICD-10-CM | POA: Diagnosis not present

## 2020-02-29 LAB — CBC WITH DIFFERENTIAL/PLATELET
Abs Immature Granulocytes: 0.43 10*3/uL — ABNORMAL HIGH (ref 0.00–0.07)
Basophils Absolute: 0.1 10*3/uL (ref 0.0–0.1)
Basophils Relative: 1 %
Eosinophils Absolute: 0.2 10*3/uL (ref 0.0–0.5)
Eosinophils Relative: 2 %
HCT: 43.3 % (ref 36.0–46.0)
Hemoglobin: 13.8 g/dL (ref 12.0–15.0)
Immature Granulocytes: 5 %
Lymphocytes Relative: 12 %
Lymphs Abs: 1.1 10*3/uL (ref 0.7–4.0)
MCH: 27.9 pg (ref 26.0–34.0)
MCHC: 31.9 g/dL (ref 30.0–36.0)
MCV: 87.5 fL (ref 80.0–100.0)
Monocytes Absolute: 0.6 10*3/uL (ref 0.1–1.0)
Monocytes Relative: 6 %
Neutro Abs: 7.1 10*3/uL (ref 1.7–7.7)
Neutrophils Relative %: 74 %
Platelets: 327 10*3/uL (ref 150–400)
RBC: 4.95 MIL/uL (ref 3.87–5.11)
RDW: 12.4 % (ref 11.5–15.5)
WBC: 9.4 10*3/uL (ref 4.0–10.5)
nRBC: 0 % (ref 0.0–0.2)

## 2020-02-29 LAB — COMPREHENSIVE METABOLIC PANEL
ALT: 34 U/L (ref 0–44)
AST: 39 U/L (ref 15–41)
Albumin: 2.9 g/dL — ABNORMAL LOW (ref 3.5–5.0)
Alkaline Phosphatase: 47 U/L (ref 38–126)
Anion gap: 12 (ref 5–15)
BUN: 21 mg/dL (ref 8–23)
CO2: 20 mmol/L — ABNORMAL LOW (ref 22–32)
Calcium: 8.8 mg/dL — ABNORMAL LOW (ref 8.9–10.3)
Chloride: 109 mmol/L (ref 98–111)
Creatinine, Ser: 0.72 mg/dL (ref 0.44–1.00)
GFR calc Af Amer: >60 mL/min (ref >60)
GFR calc non Af Amer: >60 mL/min (ref >60)
Glucose, Bld: 179 mg/dL — ABNORMAL HIGH (ref 70–99)
Potassium: 4.5 mmol/L (ref 3.5–5.1)
Sodium: 141 mmol/L (ref 135–145)
Total Bilirubin: 0.6 mg/dL (ref 0.3–1.2)
Total Protein: 6 g/dL — ABNORMAL LOW (ref 6.5–8.1)

## 2020-02-29 LAB — GLUCOSE, CAPILLARY
Glucose-Capillary: 139 mg/dL — ABNORMAL HIGH (ref 70–99)
Glucose-Capillary: 190 mg/dL — ABNORMAL HIGH (ref 70–99)
Glucose-Capillary: 140 mg/dL — ABNORMAL HIGH (ref 70–99)
Glucose-Capillary: 135 mg/dL — ABNORMAL HIGH (ref 70–99)

## 2020-02-29 LAB — MAGNESIUM: Magnesium: 2.3 mg/dL (ref 1.7–2.4)

## 2020-02-29 LAB — FERRITIN: Ferritin: 1403 ng/mL — ABNORMAL HIGH (ref 11–307)

## 2020-02-29 LAB — C-REACTIVE PROTEIN: CRP: 1.6 mg/dL — ABNORMAL HIGH (ref <1.0)

## 2020-02-29 LAB — PHOSPHORUS: Phosphorus: 3.3 mg/dL (ref 2.5–4.6)

## 2020-02-29 LAB — D-DIMER, QUANTITATIVE: D-Dimer, Quant: 0.61 ug/mL-FEU — ABNORMAL HIGH (ref 0.00–0.50)

## 2020-02-29 NOTE — Progress Notes (Signed)
PROGRESS NOTE    Bonnie Alexander  LYY:[id number] DOB: Oct 08, 1951 DOA: 02/25/2020 PCP: Daisy Floro, MD   Brief Narrative:68 y.o.femalewith medical history significant ofOSA, anxiety, fibromyalgia, GERD, hyperlipidemia who presents with approximately 10 days of respiratory symptoms and confirmeddiagnosis of Covid. Patient reports she was in usual state of health up until last Wednesday. She had family visiting from [place] and it is unclear who had Covid or where the exposure was but now 6 other family members have also tested positive including her husband who has cancer. Patient reports last week her symptoms were self-limited to allergy type symptoms with postnasal drip. She reports she did have an odd taste. She was tested on Monday and subsequently on Wednesday receive the result that she had Covid. She has been feeling extremely weak. She has had some fevers and intermittent cough with shortness of breath her predominant symptom is weakness. She states it feels like a very bad flu. She was going to have outpatient infusion for Covid but was noted to be hypoxemic thus was sent to the ER for admission.  Assessment & Plan:   Active Problems:   Acute hypoxemic respiratory failure (HCC)   COVID-19 virus infection   Hypoxia   #1 acute hypoxic respiratory failure secondary to Covid pneumonia.  Patient requires 4 L of oxygen to maintain her saturation.  Patient had multiple family members at home who has Covid. Continue remdesivir and Decadron. Inflammatory markers-are trending down. Procalcitonin is low. Continue vitamin and zinc Encourage her to be proning while in bed Continue incentive spirometry He is tachycardic and tachypneic intermittently. Saturation on room air is 88%.  Chest x-ray reviewed by me bilateral groundglass opacities. Status post Actemra 02/27/2020 Out of bed  #2 fibromyalgia not on any medications at home  #3 history of GERD stable  #4  hyperlipidemia not on any medications at home    Estimated body mass index is 27.44 kg/m as calculated from the following:   Height as of this encounter: 5\' 1"  (1.549 m).   Weight as of this encounter: 65.9 kg.  DVT prophylaxis:Lovenox  code Status:Full code Family Communication:Discussed with patient disposition Plan:Patient came from home Plan is to discharge her home when she is medically stable. Barrier to discharge hypoxia and Covid pneumonia  Consultants:  None  Procedures:None Antimicrobialsnone  Subjective: Patient is resting in bed reports slightly better than yesterday however remains short of breath and dyspneic on exertion with any minimal movement  Objective: Vitals:   02/28/20 1207 02/28/20 2133 02/29/20 0455 02/29/20 1427  BP: 124/69 120/75 115/66 122/63  Pulse: 95 91 79 92  Resp: 17 (!) 22 (!) 22   Temp: 98.2 F (36.8 C) 98.7 F (37.1 C) 98.1 F (36.7 C) 98.2 F (36.8 C)  TempSrc: Oral Oral  Oral  SpO2: 90% 91% 91% 91%  Weight:      Height:        Intake/Output Summary (Last 24 hours) at 02/29/2020 1502 Last data filed at 02/29/2020 0845 Gross per 24 hour  Intake 490 ml  Output -  Net 490 ml   Filed Weights   02/25/20 1229 02/25/20 2010  Weight: 70.3 kg 65.9 kg    Examination:  General exam: Appears calm and comfortable  Respiratory system: Rhonchi bilaterally to auscultation. Respiratory effort normal. Cardiovascular system: S1 & S2 heard, RRR. No JVD, murmurs, rubs, gallops or clicks. No pedal edema. Gastrointestinal system: Abdomen is nondistended, soft and nontender. No organomegaly or masses felt. Normal bowel sounds heard.  Central nervous system: Alert and oriented. No focal neurological deficits. Extremities: Symmetric 5 x 5 power. Skin: No rashes, lesions or ulcers Psychiatry: Judgement and insight appear normal. Mood & affect appropriate.     Data Reviewed: I have personally reviewed following labs and imaging studies   CBC: Recent Labs  Lab 02/25/20 1239 02/26/20 0034 02/27/20 0410 02/28/20 0402 02/29/20 0441  WBC 7.5 8.6 8.1 10.5 9.4  NEUTROABS 5.7 7.0 5.9 8.1* 7.1  HGB 15.0 14.6 14.1 14.1 13.8  HCT 45.6 44.8 43.3 42.6 43.3  MCV 86.2 85.3 85.2 85.2 87.5  PLT 165 194 237 292 130   Basic Metabolic Panel: Recent Labs  Lab 02/25/20 1239 02/26/20 0034 02/27/20 0410 02/28/20 0402 02/29/20 0441  NA 136 135 139 137 141  K 3.6 3.6 3.9 3.9 4.5  CL 98 99 103 103 109  CO2 24 21* 23 24 20*  GLUCOSE 115* 131* 166* 171* 179*  BUN 16 16 22 23 21   CREATININE 0.78 0.72 0.61 0.75 0.72  CALCIUM 8.4* 8.6* 9.0 8.4* 8.8*  MG  --  2.3 2.7* 2.4 2.3  PHOS  --  2.7 2.5 2.6 3.3   GFR: Estimated Creatinine Clearance: 60.1 mL/min (by C-G formula based on SCr of 0.72 mg/dL). Liver Function Tests: Recent Labs  Lab 02/25/20 1239 02/26/20 0034 02/27/20 0410 02/28/20 0402 02/29/20 0441  AST 60* 58* 52* 41 39  ALT 34 34 34 34 34  ALKPHOS 49 44 45 49 47  BILITOT 1.3* 1.4* 0.7 0.8 0.6  PROT 7.0 7.1 7.0 6.4* 6.0*  ALBUMIN 3.3* 3.4* 3.2* 3.1* 2.9*   No results for input(s): LIPASE, AMYLASE in the last 168 hours. No results for input(s): AMMONIA in the last 168 hours. Coagulation Profile: No results for input(s): INR, PROTIME in the last 168 hours. Cardiac Enzymes: No results for input(s): CKTOTAL, CKMB, CKMBINDEX, TROPONINI in the last 168 hours. BNP (last 3 results) No results for input(s): PROBNP in the last 8760 hours. HbA1C: No results for input(s): HGBA1C in the last 72 hours. CBG: Recent Labs  Lab 02/28/20 1159 02/28/20 1641 02/28/20 2207 02/29/20 0825 02/29/20 1142  GLUCAP 250* 122* 169* 139* 190*   Lipid Profile: No results for input(s): CHOL, HDL, LDLCALC, TRIG, CHOLHDL, LDLDIRECT in the last 72 hours. Thyroid Function Tests: No results for input(s): TSH, T4TOTAL, FREET4, T3FREE, THYROIDAB in the last 72 hours. Anemia Panel: Recent Labs    02/28/20 0402 02/29/20 0441  FERRITIN  1,796* 1,403*   Sepsis Labs: Recent Labs  Lab 02/25/20 1238 02/25/20 1239 02/25/20 2045  PROCALCITON  --  <0.10 <0.10  LATICACIDVEN 1.7  --   --     Recent Results (from the past 240 hour(s))  Novel Coronavirus, NAA ([facility])     Status: Abnormal   Collection Time: 02/21/20 12:00 AM   Specimen: Nasopharyngeal(NP) swabs in vial transport medium   NASOPHARYNGE  TESTING  Result Value Ref Range Status   SARS-CoV-2, NAA Detected (A) Not Detected Final    Comment: Patients who have a positive COVID-19 test result may now have treatment options. Treatment options are available for patients with mild to moderate symptoms and for hospitalized patients. Visit our website at [email or web address] for resources and information. This nucleic acid amplification test was developed and its performance characteristics determined by [employer]. Nucleic acid amplification tests include RT-PCR and TMA. This test has not been FDA cleared or approved. This test has been authorized by FDA under an Emergency Use Authorization (EUA). This test  is only authorized for the duration of time the declaration that circumstances exist justifying the authorization of the emergency use of in vitro diagnostic tests for detection of SARS-CoV-2 virus and/or diagnosis of COVID-19 infection under section 564(b)(1) of the Act, 21 U.S.C. 892JJH-4(R) (1), unless the authorization is terminated or revoked sooner. When diagnostic testing is negativ e, the possibility of a false negative result should be considered in the context of a patient's recent exposures and the presence of clinical signs and symptoms consistent with COVID-19. An individual without symptoms of COVID-19 and who is not shedding SARS-CoV-2 virus would expect to have a negative (not detected) result in this assay.   SARS-COV-2, NAA 2 DAY TAT     Status: None   Collection Time: 02/21/20 12:00 AM   NASOPHARYNGE  TESTING  Result  Value Ref Range Status   SARS-CoV-2, NAA 2 DAY TAT Performed  Final  Blood Culture (routine x 2)     Status: None (Preliminary result)   Collection Time: 02/25/20 12:38 PM   Specimen: BLOOD  Result Value Ref Range Status   Specimen Description BLOOD SITE NOT SPECIFIED  Final   Special Requests   Final    BOTTLES DRAWN AEROBIC AND ANAEROBIC Blood Culture results may not be optimal due to an inadequate volume of blood received in culture bottles Performed at [facility], [address], [place], [place] [address]    Culture   Final    NO GROWTH 4 DAYS Performed at [facility], [address] [place], [place] [address]    Report Status PENDING  Incomplete  Blood Culture (routine x 2)     Status: None (Preliminary result)   Collection Time: 02/25/20 12:39 PM   Specimen: BLOOD LEFT HAND  Result Value Ref Range Status   Specimen Description   Final    BLOOD LEFT HAND Performed at [facility], [address] [place], [place] [address]    Special Requests   Final    BOTTLES DRAWN AEROBIC AND ANAEROBIC Blood Culture adequate volume Performed at [facility], [address] [place], [place] [address]    Culture   Final    NO GROWTH 4 DAYS Performed at [facility], [address], [place], [place] [address]    Report Status PENDING  Incomplete         Radiology Studies: No results found.      Scheduled Meds: . vitamin C  500 mg Oral Daily  . calcium carbonate  1 tablet Oral Q breakfast  . dexamethasone  6 mg Oral Daily  . enoxaparin (LOVENOX) injection  40 mg Subcutaneous Q24H  . fluticasone  1 spray Each Nare Daily  . insulin aspart  0-9 Units Subcutaneous TID WC  . Ipratropium-Albuterol  1 puff Inhalation Q6H  . multivitamin with minerals  1 tablet Oral Daily  . trimethoprim  100 mg Oral Daily  . zinc sulfate  220 mg Oral Daily   Continuous Infusions:   LOS: 4 days     Alwyn Ren, MD  02/29/2020, 3:02 PM

## 2020-02-29 NOTE — Evaluation (Signed)
Physical Therapy Evaluation Patient Details Name: Bonnie Alexander MRN: [id number] DOB: 08-Jan-1951 Today's Date: 02/29/2020      History of Present Illness  69 yo female admitted with COVID Pna. Hx of fibromyalgia, vertigo  Clinical Impression  On eval, pt was Min guard assist for mobility. She walked ~75 feet with a RW. Pt participated well. She fatigues fairly easily with activity. Discussed d/c plan-she will return home where she lives with her husband. She is agreeable to HHPT and RW use for ambulation safety. Will continue to follow during hospital stay. O2 reading: 91% 4L at rest, 86% 4L with ambulation, 90% on 6L with ambulation.     Follow Up Recommendations Home health PT    Equipment Recommendations  Rolling walker with 5" wheels    Recommendations for Other Services       Precautions / Restrictions Precautions Precautions: Fall Precaution Comments: airborne Restrictions Weight Bearing Restrictions: No      Mobility  Bed Mobility Overal bed mobility: Needs Assistance Bed Mobility: Supine to Sit;Sit to Supine     Supine to sit: Supervision;HOB elevated Sit to supine: Supervision;HOB elevated   General bed mobility comments: for safety.  Transfers Overall transfer level: Needs assistance Equipment used: Rolling walker (2 wheeled) Transfers: Sit to/from Stand Sit to Stand: Min guard         General transfer comment: close guard for safety.  Ambulation/Gait Ambulation/Gait assistance: Min guard Gait Distance (Feet): 75 Feet Assistive device: Rolling walker (2 wheeled) Gait Pattern/deviations: Step-through pattern;Decreased stride length     General Gait Details: Slow but steady pace with RW. O2 86% on 4L, 90% on 6L. Dyspnea 2/4. Fatigues fairly easily.  Stairs            Wheelchair Mobility    Modified Rankin (Stroke Patients Only)       Balance Overall balance assessment: Needs assistance         Standing balance support: Bilateral  upper extremity supported Standing balance-Leahy Scale: Poor                               Pertinent Vitals/Pain Pain Assessment: No/denies pain    Home Living Family/patient expects to be discharged to:: Private residence Living Arrangements: Spouse/significant other Available Help at Discharge: Family Type of Home: House Home Access: Stairs to enter Entrance Stairs-Rails: None Technical brewer of Steps: 1 Home Layout: One level Home Equipment: None      Prior Function Level of Independence: Independent               Hand Dominance        Extremity/Trunk Assessment   Upper Extremity Assessment Upper Extremity Assessment: Generalized weakness    Lower Extremity Assessment Lower Extremity Assessment: Generalized weakness    Cervical / Trunk Assessment Cervical / Trunk Assessment: Normal  Communication   Communication: No difficulties  Cognition Arousal/Alertness: Awake/alert Behavior During Therapy: WFL for tasks assessed/performed Overall Cognitive Status: Within Functional Limits for tasks assessed                                        General Comments      Exercises     Assessment/Plan    PT Assessment Patient needs continued PT services  PT Problem List Decreased strength;Decreased mobility;Decreased activity tolerance;Decreased balance;Decreased knowledge of use of DME;Cardiopulmonary status limiting activity  PT Treatment Interventions DME instruction;Gait training;Therapeutic activities;Therapeutic exercise;Patient/family education;Balance training;Functional mobility training    PT Goals (Current goals can be found in the Care Plan section)  Acute Rehab PT Goals Patient Stated Goal: to get better PT Goal Formulation: With patient Time For Goal Achievement: 03/14/20 Potential to Achieve Goals: Good    Frequency Min 3X/week   Barriers to discharge        Co-evaluation                AM-PAC PT "6 Clicks" Mobility  Outcome Measure Help needed turning from your back to your side while in a flat bed without using bedrails?: A Little Help needed moving from lying on your back to sitting on the side of a flat bed without using bedrails?: A Little Help needed moving to and from a bed to a chair (including a wheelchair)?: A Little Help needed standing up from a chair using your arms (e.g., wheelchair or bedside chair)?: A Little Help needed to walk in hospital room?: A Little Help needed climbing 3-5 steps with a railing? : A Little 6 Click Score: 18    End of Session Equipment Utilized During Treatment: Oxygen Activity Tolerance: Patient tolerated treatment well Patient left: in bed;with call bell/phone within reach   PT Visit Diagnosis: Unsteadiness on feet (R26.81);Muscle weakness (generalized) (M62.81);Difficulty in walking, not elsewhere classified (R26.2)    Time: 5102-5852 PT Time Calculation (min) (ACUTE ONLY): 29 min   Charges:   PT Evaluation $PT Eval Moderate Complexity: 1 Mod PT Treatments $Gait Training: 8-22 mins           Faye Ramsay, PT Acute Rehabilitation

## 2020-03-01 DIAGNOSIS — U071 COVID-19: Secondary | ICD-10-CM | POA: Diagnosis not present

## 2020-03-01 DIAGNOSIS — J9601 Acute respiratory failure with hypoxia: Secondary | ICD-10-CM | POA: Diagnosis not present

## 2020-03-01 LAB — COMPREHENSIVE METABOLIC PANEL
ALT: 43 U/L (ref 0–44)
AST: 44 U/L — ABNORMAL HIGH (ref 15–41)
Albumin: 3.2 g/dL — ABNORMAL LOW (ref 3.5–5.0)
Alkaline Phosphatase: 56 U/L (ref 38–126)
Anion gap: 11 (ref 5–15)
BUN: 21 mg/dL (ref 8–23)
CO2: 20 mmol/L — ABNORMAL LOW (ref 22–32)
Calcium: 8.6 mg/dL — ABNORMAL LOW (ref 8.9–10.3)
Chloride: 105 mmol/L (ref 98–111)
Creatinine, Ser: 0.78 mg/dL (ref 0.44–1.00)
GFR calc Af Amer: >60 mL/min (ref >60)
GFR calc non Af Amer: >60 mL/min (ref >60)
Glucose, Bld: 135 mg/dL — ABNORMAL HIGH (ref 70–99)
Potassium: 4.1 mmol/L (ref 3.5–5.1)
Sodium: 136 mmol/L (ref 135–145)
Total Bilirubin: 0.8 mg/dL (ref 0.3–1.2)
Total Protein: 6.6 g/dL (ref 6.5–8.1)

## 2020-03-01 LAB — CBC WITH DIFFERENTIAL/PLATELET
Abs Immature Granulocytes: 1.07 10*3/uL — ABNORMAL HIGH (ref 0.00–0.07)
Basophils Absolute: 0 10*3/uL (ref 0.0–0.1)
Basophils Relative: 0 %
Eosinophils Absolute: 0 10*3/uL (ref 0.0–0.5)
Eosinophils Relative: 0 %
HCT: 48.3 % — ABNORMAL HIGH (ref 36.0–46.0)
Hemoglobin: 15.1 g/dL — ABNORMAL HIGH (ref 12.0–15.0)
Immature Granulocytes: 7 %
Lymphocytes Relative: 13 %
Lymphs Abs: 1.9 10*3/uL (ref 0.7–4.0)
MCH: 27.9 pg (ref 26.0–34.0)
MCHC: 31.3 g/dL (ref 30.0–36.0)
MCV: 89.1 fL (ref 80.0–100.0)
Monocytes Absolute: 1.2 10*3/uL — ABNORMAL HIGH (ref 0.1–1.0)
Monocytes Relative: 9 %
Neutro Abs: 10.5 10*3/uL — ABNORMAL HIGH (ref 1.7–7.7)
Neutrophils Relative %: 71 %
Platelets: 454 10*3/uL — ABNORMAL HIGH (ref 150–400)
RBC: 5.42 MIL/uL — ABNORMAL HIGH (ref 3.87–5.11)
RDW: 12.5 % (ref 11.5–15.5)
WBC: 14.7 10*3/uL — ABNORMAL HIGH (ref 4.0–10.5)
nRBC: 0.3 % — ABNORMAL HIGH (ref 0.0–0.2)

## 2020-03-01 LAB — CULTURE, BLOOD (ROUTINE X 2)
Culture: NO GROWTH
Culture: NO GROWTH
Special Requests: ADEQUATE

## 2020-03-01 LAB — LIPID PANEL
Cholesterol: 94 mg/dL (ref 0–200)
HDL: 18 mg/dL — ABNORMAL LOW (ref >40)
LDL Cholesterol: 41 mg/dL (ref 0–99)
Total CHOL/HDL Ratio: 5.2 RATIO
Triglycerides: 177 mg/dL — ABNORMAL HIGH (ref <150)
VLDL: 35 mg/dL (ref 0–40)

## 2020-03-01 LAB — D-DIMER, QUANTITATIVE: D-Dimer, Quant: 0.77 ug/mL-FEU — ABNORMAL HIGH (ref 0.00–0.50)

## 2020-03-01 LAB — C-REACTIVE PROTEIN: CRP: 1.1 mg/dL — ABNORMAL HIGH (ref <1.0)

## 2020-03-01 LAB — MAGNESIUM: Magnesium: 2.4 mg/dL (ref 1.7–2.4)

## 2020-03-01 LAB — GLUCOSE, CAPILLARY
Glucose-Capillary: 124 mg/dL — ABNORMAL HIGH (ref 70–99)
Glucose-Capillary: 171 mg/dL — ABNORMAL HIGH (ref 70–99)
Glucose-Capillary: 153 mg/dL — ABNORMAL HIGH (ref 70–99)

## 2020-03-01 LAB — PHOSPHORUS: Phosphorus: 3.6 mg/dL (ref 2.5–4.6)

## 2020-03-01 LAB — FERRITIN: Ferritin: 1478 ng/mL — ABNORMAL HIGH (ref 11–307)

## 2020-03-01 MED ORDER — ALUM & MAG HYDROXIDE-SIMETH 200-200-20 MG/5ML PO SUSP
15.0000 mL | Freq: Four times a day (QID) | ORAL | Status: DC | PRN
  Administered 2020-03-03: 15 mL via ORAL
  Filled 2020-03-01: qty 30

## 2020-03-01 MED ORDER — SIMETHICONE 80 MG PO CHEW
80.0000 mg | CHEWABLE_TABLET | Freq: Four times a day (QID) | ORAL | Status: DC | PRN
  Filled 2020-03-01: qty 1

## 2020-03-01 MED ORDER — SODIUM CHLORIDE 0.9 % IV BOLUS
500.0000 mL | Freq: Once | INTRAVENOUS | Status: AC
  Administered 2020-03-01: 500 mL via INTRAVENOUS

## 2020-03-01 MED ORDER — FAMOTIDINE 20 MG PO TABS
10.0000 mg | ORAL_TABLET | Freq: Every day | ORAL | Status: DC
  Administered 2020-03-01 – 2020-03-07 (×7): 10 mg via ORAL
  Filled 2020-03-01 (×7): qty 1

## 2020-03-01 NOTE — Progress Notes (Signed)
Physical Therapy Treatment Patient Details Name: Bonnie Alexander MRN: [id number] DOB: April 22, 1951 Today's Date: 03/01/2020    History of Present Illness 69 yo female admitted with COVID Pna. Hx of fibromyalgia, vertigo    PT Comments    Pt ambulated in hallway and maintained on 4L O2 [place] with Spo2 88-90%.  Pt encouraged to perform pursed lip breathing.  SPO2 93% at rest on 4L O2 [place].    Follow Up Recommendations  Home health PT     Equipment Recommendations  Rolling walker with 5" wheels    Recommendations for Other Services       Precautions / Restrictions Precautions Precautions: Fall Precaution Comments: monitor sats    Mobility  Bed Mobility Overal bed mobility: Needs Assistance Bed Mobility: Supine to Sit;Sit to Supine     Supine to sit: Supervision;HOB elevated Sit to supine: Supervision;HOB elevated   General bed mobility comments: for safety.  Transfers Overall transfer level: Needs assistance Equipment used: Rolling walker (2 wheeled) Transfers: Sit to/from Stand Sit to Stand: Min guard         General transfer comment: close guard for safety.  Ambulation/Gait Ambulation/Gait assistance: Min guard Gait Distance (Feet): 80 Feet Assistive device: Rolling walker (2 wheeled) Gait Pattern/deviations: Step-through pattern;Decreased stride length     General Gait Details: Slow but steady pace with RW. SpO2 88-90% on 4L, Dyspnea 2/4. Pt feels breathing a little better today.   Stairs             Wheelchair Mobility    Modified Rankin (Stroke Patients Only)       Balance                                            Cognition Arousal/Alertness: Awake/alert Behavior During Therapy: WFL for tasks assessed/performed Overall Cognitive Status: Within Functional Limits for tasks assessed                                        Exercises      General Comments        Pertinent Vitals/Pain Pain Assessment:  0-10 Pain Score: 8  Pain Location: GERD Pain Intervention(s): Repositioned;Monitored during session(RN already aware)    Home Living                      Prior Function            PT Goals (current goals can now be found in the care plan section) Progress towards PT goals: Progressing toward goals    Frequency    Min 3X/week      PT Plan Current plan remains appropriate    Co-evaluation              AM-PAC PT "6 Clicks" Mobility   Outcome Measure  Help needed turning from your back to your side while in a flat bed without using bedrails?: A Little Help needed moving from lying on your back to sitting on the side of a flat bed without using bedrails?: A Little Help needed moving to and from a bed to a chair (including a wheelchair)?: A Little Help needed standing up from a chair using your arms (e.g., wheelchair or bedside chair)?: A Little Help needed to walk in hospital room?: A  Little Help needed climbing 3-5 steps with a railing? : A Little 6 Click Score: 18    End of Session Equipment Utilized During Treatment: Oxygen Activity Tolerance: Patient tolerated treatment well Patient left: in bed;with call bell/phone within reach Nurse Communication: Mobility status PT Visit Diagnosis: Unsteadiness on feet (R26.81);Muscle weakness (generalized) (M62.81);Difficulty in walking, not elsewhere classified (R26.2)     Time: 4765-4650 PT Time Calculation (min) (ACUTE ONLY): 19 min  Charges:  $Gait Training: 8-22 mins                     Paulino Door, DPT Acute Rehabilitation Services Office: [phone]  Sarajane Jews 03/01/2020, 3:57 PM

## 2020-03-01 NOTE — Progress Notes (Signed)
PROGRESS NOTE    Bonnie Alexander    Code Status: Full Code  PTW:[id number] DOB: 1951-10-15 DOA: 02/25/2020 LOS: 5 days  PCP: Daisy Floro, MD CC:  Chief Complaint  Patient presents with  . COVID Positive  . Low Oxygen Saturation       Hospital Summary   This is a 69 year old female with history of OSA, anxiety, fibromyalgia, GERD, hyperlipidemia who presented with about 10 days of respiratory symptoms and confirmed diagnosis of COVID-19.  She was going to have outpatient infusion for Covid but was noted to be hypoxemic and was sent to the ED for admission.  Has since been requiring 4 L O2, room air at baseline  A & P   Active Problems:   Acute hypoxemic respiratory failure (HCC)   COVID-19 virus infection   Hypoxia   1. Acute hypoxic respiratory failure secondary to COVID-19 pneumonia a. Continues to require 4 L/min nasal cannula of supplemental O2 b. S/p 5 days remdesivir, 6/10 days dexamethasone, s/p Actemra c. D-dimer 0.7 d. Continue Combivent, dexamethasone, flutter valve and incentive spirometry, encourage pronation as tolerated e. Continue vitamins and zinc  2. GERD a. Maalox as needed and Pepcid daily  3. Hyperlipidemia a. Not on any medications, check lipid panel  4. History of fibromyalgia not on any medications  5. Polycythemia, likely secondary to hemoconcentration a. Increased platelets, Hb 15.1 increased from yesterday and increased white count, likely hemoconcentrated b. will give small bolus of fluid  DVT prophylaxis: Lovenox Family Communication: Patient updated Disposition Plan:  Status is: Inpatient  Remains inpatient appropriate because:Inpatient level of care appropriate due to severity of illness   Dispo: The patient is from: Home              Anticipated d/c is to: Home              Anticipated d/c date is: 2 days              Patient currently is not medically stable to d/c.  Needs to have improved O2 requirements, currently on 4L/min  O2           Pressure injury documentation    None  Consultants  None  Procedures  None  Antibiotics   Anti-infectives (From admission, onward)   Start     Dose/Rate Route Frequency Ordered Stop   02/26/20 1000  remdesivir 100 mg in sodium chloride 0.9 % 100 mL IVPB  Status:  Discontinued     100 mg 200 mL/hr over 30 Minutes Intravenous Daily 02/25/20 2009 02/25/20 2022   02/26/20 1000  remdesivir 100 mg in sodium chloride 0.9 % 100 mL IVPB     100 mg 200 mL/hr over 30 Minutes Intravenous Daily 02/25/20 1837 02/29/20 0935   02/25/20 2145  remdesivir 100 mg in sodium chloride 0.9 % 100 mL IVPB     100 mg 200 mL/hr over 30 Minutes Intravenous Every hour 02/25/20 2053 02/26/20 0000   02/25/20 2100  trimethoprim (TRIMPEX) tablet 100 mg     100 mg Oral Daily 02/25/20 2009     02/25/20 2008  remdesivir 200 mg in sodium chloride 0.9% 250 mL IVPB  Status:  Discontinued     200 mg 580 mL/hr over 30 Minutes Intravenous Once 02/25/20 2009 02/25/20 2022   02/25/20 2000  remdesivir 100 mg in sodium chloride 0.9 % 100 mL IVPB  Status:  Discontinued     100 mg 200 mL/hr over 30 Minutes Intravenous Every  hour 02/25/20 1837 02/25/20 2057        Subjective   Seen and examined at bedside no acute distress resting comfortably.  She denies any significant complaints at this time.  No overnight events  Objective   Vitals:   02/29/20 0455 02/29/20 1427 02/29/20 2027 03/01/20 1150  BP: 115/66 122/63 129/79 102/89  Pulse: 79 92 92   Resp: (!) 22  20   Temp: 98.1 F (36.7 C) 98.2 F (36.8 C)  97.9 F (36.6 C)  TempSrc:  Oral  Oral  SpO2: 91% 91% 93%   Weight:      Height:        Intake/Output Summary (Last 24 hours) at 03/01/2020 1714 Last data filed at 03/01/2020 0956 Gross per 24 hour  Intake 420 ml  Output --  Net 420 ml   Filed Weights   02/25/20 1229 02/25/20 2010  Weight: 70.3 kg 65.9 kg    Examination:  Physical Exam Vitals and nursing note reviewed.   Constitutional:      Appearance: Normal appearance.  HENT:     Head: Normocephalic and atraumatic.  Eyes:     Conjunctiva/sclera: Conjunctivae normal.  Cardiovascular:     Rate and Rhythm: Normal rate.     Pulses: Normal pulses.  Pulmonary:     Effort: Pulmonary effort is normal. No respiratory distress.     Comments: On 4L/min [place] Abdominal:     General: Abdomen is flat.     Palpations: Abdomen is soft.  Musculoskeletal:        General: No swelling or tenderness.  Skin:    Coloration: Skin is not jaundiced or pale.  Neurological:     Mental Status: She is alert. Mental status is at baseline.  Psychiatric:        Mood and Affect: Mood normal.        Behavior: Behavior normal.     Data Reviewed: I have personally reviewed following labs and imaging studies  CBC: Recent Labs  Lab 02/26/20 0034 02/27/20 0410 02/28/20 0402 02/29/20 0441 03/01/20 0503  WBC 8.6 8.1 10.5 9.4 14.7*  NEUTROABS 7.0 5.9 8.1* 7.1 10.5*  HGB 14.6 14.1 14.1 13.8 15.1*  HCT 44.8 43.3 42.6 43.3 48.3*  MCV 85.3 85.2 85.2 87.5 89.1  PLT 194 237 292 327 284*   Basic Metabolic Panel: Recent Labs  Lab 02/26/20 0034 02/27/20 0410 02/28/20 0402 02/29/20 0441 03/01/20 0503  NA 135 139 137 141 136  K 3.6 3.9 3.9 4.5 4.1  CL 99 103 103 109 105  CO2 21* 23 24 20* 20*  GLUCOSE 131* 166* 171* 179* 135*  BUN 16 22 23 21 21   CREATININE 0.72 0.61 0.75 0.72 0.78  CALCIUM 8.6* 9.0 8.4* 8.8* 8.6*  MG 2.3 2.7* 2.4 2.3 2.4  PHOS 2.7 2.5 2.6 3.3 3.6   GFR: Estimated Creatinine Clearance: 60.1 mL/min (by C-G formula based on SCr of 0.78 mg/dL). Liver Function Tests: Recent Labs  Lab 02/26/20 0034 02/27/20 0410 02/28/20 0402 02/29/20 0441 03/01/20 0503  AST 58* 52* 41 39 44*  ALT 34 34 34 34 43  ALKPHOS 44 45 49 47 56  BILITOT 1.4* 0.7 0.8 0.6 0.8  PROT 7.1 7.0 6.4* 6.0* 6.6  ALBUMIN 3.4* 3.2* 3.1* 2.9* 3.2*   No results for input(s): LIPASE, AMYLASE in the last 168 hours. No results for  input(s): AMMONIA in the last 168 hours. Coagulation Profile: No results for input(s): INR, PROTIME in the last 168 hours. Cardiac  Enzymes: No results for input(s): CKTOTAL, CKMB, CKMBINDEX, TROPONINI in the last 168 hours. BNP (last 3 results) No results for input(s): PROBNP in the last 8760 hours. HbA1C: No results for input(s): HGBA1C in the last 72 hours. CBG: Recent Labs  Lab 02/29/20 1142 02/29/20 1709 02/29/20 2115 03/01/20 0807 03/01/20 1208  GLUCAP 190* 140* 135* 124* 171*   Lipid Profile: No results for input(s): CHOL, HDL, LDLCALC, TRIG, CHOLHDL, LDLDIRECT in the last 72 hours. Thyroid Function Tests: No results for input(s): TSH, T4TOTAL, FREET4, T3FREE, THYROIDAB in the last 72 hours. Anemia Panel: Recent Labs    02/29/20 0441 03/01/20 0503  FERRITIN 1,403* 1,478*   Sepsis Labs: Recent Labs  Lab 02/25/20 1238 02/25/20 1239 02/25/20 2045  PROCALCITON  --  <0.10 <0.10  LATICACIDVEN 1.7  --   --     Recent Results (from the past 240 hour(s))  Novel Coronavirus, NAA ([facility])     Status: Abnormal   Collection Time: 02/21/20 12:00 AM   Specimen: Nasopharyngeal(NP) swabs in vial transport medium   NASOPHARYNGE  TESTING  Result Value Ref Range Status   SARS-CoV-2, NAA Detected (A) Not Detected Final    Comment: Patients who have a positive COVID-19 test result may now have treatment options. Treatment options are available for patients with mild to moderate symptoms and for hospitalized patients. Visit our website at [email or web address] for resources and information. This nucleic acid amplification test was developed and its performance characteristics determined by [employer]. Nucleic acid amplification tests include RT-PCR and TMA. This test has not been FDA cleared or approved. This test has been authorized by FDA under an Emergency Use Authorization (EUA). This test is only authorized for the duration of time the declaration  that circumstances exist justifying the authorization of the emergency use of in vitro diagnostic tests for detection of SARS-CoV-2 virus and/or diagnosis of COVID-19 infection under section 564(b)(1) of the Act, 21 U.S.C. 466ZLD-3(T) (1), unless the authorization is terminated or revoked sooner. When diagnostic testing is negativ e, the possibility of a false negative result should be considered in the context of a patient's recent exposures and the presence of clinical signs and symptoms consistent with COVID-19. An individual without symptoms of COVID-19 and who is not shedding SARS-CoV-2 virus would expect to have a negative (not detected) result in this assay.   SARS-COV-2, NAA 2 DAY TAT     Status: None   Collection Time: 02/21/20 12:00 AM   NASOPHARYNGE  TESTING  Result Value Ref Range Status   SARS-CoV-2, NAA 2 DAY TAT Performed  Final  Blood Culture (routine x 2)     Status: None   Collection Time: 02/25/20 12:38 PM   Specimen: BLOOD  Result Value Ref Range Status   Specimen Description BLOOD SITE NOT SPECIFIED  Final   Special Requests   Final    BOTTLES DRAWN AEROBIC AND ANAEROBIC Blood Culture results may not be optimal due to an inadequate volume of blood received in culture bottles Performed at [facility], [address], [place], [place] [address]    Culture NO GROWTH 5 DAYS  Final   Report Status 03/01/2020 FINAL  Final  Blood Culture (routine x 2)     Status: None   Collection Time: 02/25/20 12:39 PM   Specimen: BLOOD LEFT HAND  Result Value Ref Range Status   Specimen Description BLOOD LEFT HAND  Final   Special Requests   Final    BOTTLES DRAWN AEROBIC AND ANAEROBIC  Blood Culture adequate volume Performed at [facility], [address], [place], [place] [address]    Culture NO GROWTH 5 DAYS  Final   Report Status 03/01/2020 FINAL  Final         Radiology Studies: No results found.      Scheduled Meds: .  vitamin C  500 mg Oral Daily  . calcium carbonate  1 tablet Oral Q breakfast  . dexamethasone  6 mg Oral Daily  . enoxaparin (LOVENOX) injection  40 mg Subcutaneous Q24H  . fluticasone  1 spray Each Nare Daily  . insulin aspart  0-9 Units Subcutaneous TID WC  . Ipratropium-Albuterol  1 puff Inhalation Q6H  . multivitamin with minerals  1 tablet Oral Daily  . trimethoprim  100 mg Oral Daily  . zinc sulfate  220 mg Oral Daily   Continuous Infusions:   Time spent: 27 minutes with over 50% of the time coordinating the patient's care    Jae Dire, DO [facility] Pager [phone]  Call night coverage person covering after 7pm

## 2020-03-02 ENCOUNTER — Inpatient Hospital Stay (HOSPITAL_COMMUNITY): Payer: Medicare Other

## 2020-03-02 DIAGNOSIS — J9601 Acute respiratory failure with hypoxia: Secondary | ICD-10-CM | POA: Diagnosis not present

## 2020-03-02 DIAGNOSIS — J939 Pneumothorax, unspecified: Secondary | ICD-10-CM | POA: Diagnosis not present

## 2020-03-02 DIAGNOSIS — U071 COVID-19: Secondary | ICD-10-CM | POA: Diagnosis not present

## 2020-03-02 LAB — BASIC METABOLIC PANEL
Anion gap: 10 (ref 5–15)
BUN: 21 mg/dL (ref 8–23)
CO2: 23 mmol/L (ref 22–32)
Calcium: 8.3 mg/dL — ABNORMAL LOW (ref 8.9–10.3)
Chloride: 105 mmol/L (ref 98–111)
Creatinine, Ser: 0.76 mg/dL (ref 0.44–1.00)
GFR calc Af Amer: >60 mL/min (ref >60)
GFR calc non Af Amer: >60 mL/min (ref >60)
Glucose, Bld: 140 mg/dL — ABNORMAL HIGH (ref 70–99)
Potassium: 4.5 mmol/L (ref 3.5–5.1)
Sodium: 138 mmol/L (ref 135–145)

## 2020-03-02 LAB — GLUCOSE, CAPILLARY
Glucose-Capillary: 257 mg/dL — ABNORMAL HIGH (ref 70–99)
Glucose-Capillary: 115 mg/dL — ABNORMAL HIGH (ref 70–99)
Glucose-Capillary: 208 mg/dL — ABNORMAL HIGH (ref 70–99)
Glucose-Capillary: 173 mg/dL — ABNORMAL HIGH (ref 70–99)
Glucose-Capillary: 187 mg/dL — ABNORMAL HIGH (ref 70–99)

## 2020-03-02 LAB — CBC
HCT: 41.5 % (ref 36.0–46.0)
Hemoglobin: 13.4 g/dL (ref 12.0–15.0)
MCH: 28.3 pg (ref 26.0–34.0)
MCHC: 32.3 g/dL (ref 30.0–36.0)
MCV: 87.6 fL (ref 80.0–100.0)
Platelets: 289 10*3/uL (ref 150–400)
RBC: 4.74 MIL/uL (ref 3.87–5.11)
RDW: 12.5 % (ref 11.5–15.5)
WBC: 11.1 10*3/uL — ABNORMAL HIGH (ref 4.0–10.5)
nRBC: 0 % (ref 0.0–0.2)

## 2020-03-02 LAB — MAGNESIUM: Magnesium: 2.3 mg/dL (ref 1.7–2.4)

## 2020-03-02 MED ORDER — SALINE SPRAY 0.65 % NA SOLN
1.0000 | NASAL | Status: DC | PRN
  Administered 2020-03-02: 1 via NASAL
  Filled 2020-03-02: qty 44

## 2020-03-02 MED ORDER — DIPHENHYDRAMINE HCL 25 MG PO CAPS
25.0000 mg | ORAL_CAPSULE | Freq: Three times a day (TID) | ORAL | Status: DC | PRN
  Administered 2020-03-02: 03:00:00 25 mg via ORAL
  Filled 2020-03-02: qty 1

## 2020-03-02 NOTE — Progress Notes (Signed)
Notified by Dr. Micheline Maze, radiology, of the patient's abnormal CXR: small loculated right pneumothorax.   I called Dr. Isaiah Serge, PCCM, who mentioned that the patient is not a candidate at this time for a chest tube as the pneumothorax is small and may resolve on its own. He recommended repeat CXR this afternoon and if worsened then to call PCCM back.  - Repeat CXR this afternoon  Whitney Post, DO

## 2020-03-02 NOTE — Progress Notes (Signed)
PROGRESS NOTE    Bonnie Alexander    Code Status: Full Code  GMW:[id number] DOB: 12/14/1950 DOA: 02/25/2020 LOS: 6 days  PCP: Daisy Floro, MD CC:  Chief Complaint  Patient presents with  . COVID Positive  . Low Oxygen Saturation       Hospital Summary   This is a 69 year old female with history of OSA, anxiety, fibromyalgia, GERD, hyperlipidemia who presented with about 10 days of respiratory symptoms and confirmed diagnosis of COVID-19.  She was going to have outpatient infusion for Covid but was noted to be hypoxemic and was sent to the ED for admission.  Has since been requiring 4 L O2, room air at baseline  4/15: overnight with increased O2 requirements. CXR ordered in AM: small loculated right pneumothorax. PCCM notified who stated she is not a candidate for chest tube at this time since it is small but may need a chest tube if it gets larger.   A & P   Active Problems:   Acute hypoxemic respiratory failure (HCC)   COVID-19 virus infection   Hypoxia   1. Acute hypoxic respiratory failure secondary to COVID-19 pneumonia as well as new small right loculated pneumothorax a. Increased O2 requirement overnight, 2->>7L/min b. CXR this morning: small loculated right pneumothorax. PCCM notified who stated she is not a candidate for chest tube at this time since it is small but may need a chest tube if it gets larger.  c. S/p 5 days remdesivir, 7/10 days dexamethasone, s/p Actemra d. Continue Combivent, dexamethasone, flutter valve and incentive spirometry, encourage pronation as tolerated e. Continue vitamins and zinc f. Repeat CXR this afternoon, if pneumothorax is larger, then will reach back out to PCCM.   2. GERD Maalox as needed and Pepcid daily  3. Asymptomatic NSVT a. Continue to monitor  4. Hyperlipidemia  a. LDL 41 b. Not on any medications  5. History of fibromyalgia not on any medications  6. Polycythemia, likely secondary to hemoconcentration a. Resolved  with IV fluids  DVT prophylaxis: Lovenox Family Communication: Patient updated Disposition Plan:  Status is: Inpatient  Remains inpatient appropriate because:Hemodynamically unstable and Inpatient level of care appropriate due to severity of illness   Dispo: The patient is from: Home              Anticipated d/c is to: TBD              Anticipated d/c date is: > 3 days              Patient currently is not medically stable to d/c.               Pressure injury documentation    None  Consultants  Discussed CXR findings with PCCM as above  Procedures  None  Antibiotics   Anti-infectives (From admission, onward)   Start     Dose/Rate Route Frequency Ordered Stop   02/26/20 1000  remdesivir 100 mg in sodium chloride 0.9 % 100 mL IVPB  Status:  Discontinued     100 mg 200 mL/hr over 30 Minutes Intravenous Daily 02/25/20 2009 02/25/20 2022   02/26/20 1000  remdesivir 100 mg in sodium chloride 0.9 % 100 mL IVPB     100 mg 200 mL/hr over 30 Minutes Intravenous Daily 02/25/20 1837 02/29/20 0935   02/25/20 2145  remdesivir 100 mg in sodium chloride 0.9 % 100 mL IVPB     100 mg 200 mL/hr over 30 Minutes Intravenous Every hour  02/25/20 2053 02/26/20 0000   02/25/20 2100  trimethoprim (TRIMPEX) tablet 100 mg     100 mg Oral Daily 02/25/20 2009     02/25/20 2008  remdesivir 200 mg in sodium chloride 0.9% 250 mL IVPB  Status:  Discontinued     200 mg 580 mL/hr over 30 Minutes Intravenous Once 02/25/20 2009 02/25/20 2022   02/25/20 2000  remdesivir 100 mg in sodium chloride 0.9 % 100 mL IVPB  Status:  Discontinued     100 mg 200 mL/hr over 30 Minutes Intravenous Every hour 02/25/20 1837 02/25/20 2057        Subjective   7 beats of VT overnight, asymptomatic at the time. Patient also had increased O2 requirements, up to 10L HFNC, currently on 7. Patient denies any significant complaints at this time other than persistent shortness of breath but not overly worse than  yesterday. No other events.   Objective   Vitals:   03/01/20 2120 03/02/20 0159 03/02/20 0212 03/02/20 0225  BP: 119/72  111/78   Pulse: 89  75   Resp: (!) 22     Temp: 98.3 F (36.8 C)  97.6 F (36.4 C)   TempSrc: Oral  Oral   SpO2: 91% (!) 81% 90% 91%  Weight:      Height:        Intake/Output Summary (Last 24 hours) at 03/02/2020 1328 Last data filed at 03/02/2020 1315 Gross per 24 hour  Intake 340 ml  Output --  Net 340 ml   Filed Weights   02/25/20 1229 02/25/20 2010  Weight: 70.3 kg 65.9 kg    Examination:  Physical Exam Vitals and nursing note reviewed. Exam conducted with a chaperone present.  Constitutional:      General: She is not in acute distress.    Appearance: She is not toxic-appearing.  HENT:     Head: Normocephalic and atraumatic.  Eyes:     Conjunctiva/sclera: Conjunctivae normal.  Cardiovascular:     Rate and Rhythm: Normal rate.     Pulses: Normal pulses.  Pulmonary:     Effort: Pulmonary effort is normal. No respiratory distress.     Breath sounds: No wheezing.     Comments: On nasal canula  Abdominal:     General: Abdomen is flat.     Palpations: Abdomen is soft.  Musculoskeletal:        General: No swelling or tenderness.  Skin:    Coloration: Skin is not jaundiced or pale.  Neurological:     Mental Status: She is alert. Mental status is at baseline.  Psychiatric:        Mood and Affect: Mood normal.        Behavior: Behavior normal.     Data Reviewed: I have personally reviewed following labs and imaging studies  CBC: Recent Labs  Lab 02/26/20 0034 02/26/20 0034 02/27/20 0410 02/28/20 0402 02/29/20 0441 03/01/20 0503 03/02/20 0432  WBC 8.6   < > 8.1 10.5 9.4 14.7* 11.1*  NEUTROABS 7.0  --  5.9 8.1* 7.1 10.5*  --   HGB 14.6   < > 14.1 14.1 13.8 15.1* 13.4  HCT 44.8   < > 43.3 42.6 43.3 48.3* 41.5  MCV 85.3   < > 85.2 85.2 87.5 89.1 87.6  PLT 194   < > 237 292 327 454* 289   < > = values in this interval not  displayed.   Basic Metabolic Panel: Recent Labs  Lab 02/26/20 0034 02/26/20 0034  02/27/20 0410 02/28/20 0402 02/29/20 0441 03/01/20 0503 03/02/20 0432  NA 135   < > 139 137 141 136 138  K 3.6   < > 3.9 3.9 4.5 4.1 4.5  CL 99   < > 103 103 109 105 105  CO2 21*   < > 23 24 20* 20* 23  GLUCOSE 131*   < > 166* 171* 179* 135* 140*  BUN 16   < > 22 23 21 21 21   CREATININE 0.72   < > 0.61 0.75 0.72 0.78 0.76  CALCIUM 8.6*   < > 9.0 8.4* 8.8* 8.6* 8.3*  MG 2.3   < > 2.7* 2.4 2.3 2.4 2.3  PHOS 2.7  --  2.5 2.6 3.3 3.6  --    < > = values in this interval not displayed.   GFR: Estimated Creatinine Clearance: 60.1 mL/min (by C-G formula based on SCr of 0.76 mg/dL). Liver Function Tests: Recent Labs  Lab 02/26/20 0034 02/27/20 0410 02/28/20 0402 02/29/20 0441 03/01/20 0503  AST 58* 52* 41 39 44*  ALT 34 34 34 34 43  ALKPHOS 44 45 49 47 56  BILITOT 1.4* 0.7 0.8 0.6 0.8  PROT 7.1 7.0 6.4* 6.0* 6.6  ALBUMIN 3.4* 3.2* 3.1* 2.9* 3.2*   No results for input(s): LIPASE, AMYLASE in the last 168 hours. No results for input(s): AMMONIA in the last 168 hours. Coagulation Profile: No results for input(s): INR, PROTIME in the last 168 hours. Cardiac Enzymes: No results for input(s): CKTOTAL, CKMB, CKMBINDEX, TROPONINI in the last 168 hours. BNP (last 3 results) No results for input(s): PROBNP in the last 8760 hours. HbA1C: No results for input(s): HGBA1C in the last 72 hours. CBG: Recent Labs  Lab 03/01/20 1208 03/01/20 1640 03/01/20 2142 03/02/20 0754 03/02/20 1220  GLUCAP 171* 257* 153* 115* 208*   Lipid Profile: Recent Labs    03/01/20 0503  CHOL 94  HDL 18*  LDLCALC 41  TRIG 03/03/20*  CHOLHDL 5.2   Thyroid Function Tests: No results for input(s): TSH, T4TOTAL, FREET4, T3FREE, THYROIDAB in the last 72 hours. Anemia Panel: Recent Labs    02/29/20 0441 03/01/20 0503  FERRITIN 1,403* 1,478*   Sepsis Labs: Recent Labs  Lab 02/25/20 1238 02/25/20 1239  02/25/20 2045  PROCALCITON  --  <0.10 <0.10  LATICACIDVEN 1.7  --   --     Recent Results (from the past 240 hour(s))  Blood Culture (routine x 2)     Status: None   Collection Time: 02/25/20 12:38 PM   Specimen: BLOOD  Result Value Ref Range Status   Specimen Description BLOOD SITE NOT SPECIFIED  Final   Special Requests   Final    BOTTLES DRAWN AEROBIC AND ANAEROBIC Blood Culture results may not be optimal due to an inadequate volume of blood received in culture bottles Performed at [facility], [address], [place], [place]    Culture NO GROWTH 5 DAYS  Final   Report Status 03/01/2020 FINAL  Final  Blood Culture (routine x 2)     Status: None   Collection Time: 02/25/20 12:39 PM   Specimen: BLOOD LEFT HAND  Result Value Ref Range Status   Specimen Description BLOOD LEFT HAND  Final   Special Requests   Final    BOTTLES DRAWN AEROBIC AND ANAEROBIC Blood Culture adequate volume Performed at [facility], [address] [place], [place]    Culture NO GROWTH 5 DAYS  Final   Report Status 03/01/2020 FINAL  Final         Radiology Studies: DG CHEST PORT 1 VIEW  Addendum Date: 03/02/2020   ADDENDUM REPORT: 03/02/2020 12:31 ADDENDUM: Critical Value/emergent results were called by telephone at the time of interpretation on 03/02/2020 at 11:57 a.m. to provider Amarrion Pastorino , who verbally acknowledged these results. Electronically Signed   By: Marin Olp M.D.   On: 03/02/2020 12:31   Result Date: 03/02/2020 CLINICAL DATA:  Hypoxia.  COVID-19 positive. EXAM: PORTABLE CHEST 1 VIEW COMPARISON:  02/25/2020 FINDINGS: Examination demonstrates interval development of small loculated lateral right basilar pneumothorax. Subtle persistent hazy density over the right base and lateral left base. Cardiomediastinal silhouette and remainder the exam is unchanged. IMPRESSION: Interval development of loculated small right lateral basilar  pneumothorax. Subtle hazy bibasilar opacification which may be due to infection versus atelectasis. Electronically Signed: By: Marin Olp M.D. On: 03/02/2020 11:49        Scheduled Meds: . vitamin C  500 mg Oral Daily  . calcium carbonate  1 tablet Oral Q breakfast  . dexamethasone  6 mg Oral Daily  . enoxaparin (LOVENOX) injection  40 mg Subcutaneous Q24H  . famotidine  10 mg Oral Daily  . fluticasone  1 spray Each Nare Daily  . insulin aspart  0-9 Units Subcutaneous TID WC  . Ipratropium-Albuterol  1 puff Inhalation Q6H  . multivitamin with minerals  1 tablet Oral Daily  . trimethoprim  100 mg Oral Daily  . zinc sulfate  220 mg Oral Daily   Continuous Infusions:   Time spent: 35 minutes with over 50% of the time coordinating the patient's care    Harold Hedge, DO [facility] Pager [phone]  Call night coverage person covering after 7pm

## 2020-03-02 NOTE — Progress Notes (Signed)
Received a call from tele. Pt. Had 7 runs of vtach. Nurse enter room to find pt. Sleeping. Pt. Had to go to bathroom. Pt. Dropped down to 81% [place] 3L. Pt. Did not recover. Nurse put pt. Up to 10 HFNC before pt. Maintained 89-90%. MD notified. Awaiting new orders.

## 2020-03-02 NOTE — Care Management Important Message (Signed)
Important Message  Patient Details IM Letter given to Ezekiel Ina RN Case Manager to present to the Patient Name: Bonnie Alexander MRN: [id number] Date of Birth: 1951-07-14   Medicare Important Message Given:  Yes     Caren Macadam 03/02/2020, 10:23 AM

## 2020-03-03 ENCOUNTER — Inpatient Hospital Stay (HOSPITAL_COMMUNITY): Payer: Medicare Other

## 2020-03-03 DIAGNOSIS — J9601 Acute respiratory failure with hypoxia: Secondary | ICD-10-CM | POA: Diagnosis not present

## 2020-03-03 LAB — CBC
HCT: 42.5 % (ref 36.0–46.0)
Hemoglobin: 13.8 g/dL (ref 12.0–15.0)
MCH: 28.3 pg (ref 26.0–34.0)
MCHC: 32.5 g/dL (ref 30.0–36.0)
MCV: 87.3 fL (ref 80.0–100.0)
Platelets: 282 10*3/uL (ref 150–400)
RBC: 4.87 MIL/uL (ref 3.87–5.11)
RDW: 12.7 % (ref 11.5–15.5)
WBC: 11.5 10*3/uL — ABNORMAL HIGH (ref 4.0–10.5)
nRBC: 0.2 % (ref 0.0–0.2)

## 2020-03-03 LAB — BASIC METABOLIC PANEL
Anion gap: 8 (ref 5–15)
BUN: 20 mg/dL (ref 8–23)
CO2: 24 mmol/L (ref 22–32)
Calcium: 8.5 mg/dL — ABNORMAL LOW (ref 8.9–10.3)
Chloride: 104 mmol/L (ref 98–111)
Creatinine, Ser: 0.64 mg/dL (ref 0.44–1.00)
GFR calc Af Amer: >60 mL/min (ref >60)
GFR calc non Af Amer: >60 mL/min (ref >60)
Glucose, Bld: 134 mg/dL — ABNORMAL HIGH (ref 70–99)
Potassium: 4.7 mmol/L (ref 3.5–5.1)
Sodium: 136 mmol/L (ref 135–145)

## 2020-03-03 LAB — GLUCOSE, CAPILLARY
Glucose-Capillary: 106 mg/dL — ABNORMAL HIGH (ref 70–99)
Glucose-Capillary: 102 mg/dL — ABNORMAL HIGH (ref 70–99)
Glucose-Capillary: 112 mg/dL — ABNORMAL HIGH (ref 70–99)
Glucose-Capillary: 196 mg/dL — ABNORMAL HIGH (ref 70–99)
Glucose-Capillary: 160 mg/dL — ABNORMAL HIGH (ref 70–99)

## 2020-03-03 NOTE — Plan of Care (Signed)
Discussed with patient plan of care for the evening, pain management and signs and symptoms of pneumothorax with some teach back displayed.  Patient stated that they would update family at this time.

## 2020-03-03 NOTE — Progress Notes (Signed)
Physical Therapy Treatment Patient Details Name: Bonnie Alexander MRN: [id number] DOB: [birth year] Today's Date: 03/03/2020    History of Present Illness 69 yo female admitted with COVID Pna. Hx of fibromyalgia, vertigo    PT Comments    Patient ambulated x100' on 2 L [place] Spo2 rest 95%, dropping to 86% with mobility.  HR 121 max from 98. Instructed in U and LE exercises. Continue progressive mobility.  Follow Up Recommendations   HHPT     Equipment Recommendations    TBA   Recommendations for Other Services       Precautions / Restrictions Precautions Precautions: Fall Precaution Comments: monitor sats    Mobility  Bed Mobility         Supine to sit: Supervision;HOB elevated Sit to supine: Supervision;HOB elevated   General bed mobility comments: for safety.  Transfers   Equipment used: 1 person hand held assist   Sit to Stand: Min guard         General transfer comment: close guard for safety.  Ambulation/Gait Ambulation/Gait assistance: Min guard;Min assist Gait Distance (Feet): 100 Feet Assistive device: 1 person hand held assist(rail) Gait Pattern/deviations: Step-through pattern;Decreased stride length     General Gait Details: Slow but steady pace with RW. SpO2 88-90% on 4L, Dyspnea 2/4. Pt feels breathing a little better today.   Stairs             Wheelchair Mobility    Modified Rankin (Stroke Patients Only)       Balance                                            Cognition Arousal/Alertness: Awake/alert Behavior During Therapy: WFL for tasks assessed/performed Overall Cognitive Status: Within Functional Limits for tasks assessed                                        Exercises General Exercises - Lower Extremity Long Arc Quad: AROM;10 reps;Seated;Both Hip Flexion/Marching: AROM Toe Raises: Seated;Both;10 reps Other Exercises Other Exercises: orange TB for shoulder abduction, flexion elb  flex and ext. x 5 each UE    General Comments        Pertinent Vitals/Pain      Home Living                      Prior Function            PT Goals (current goals can now be found in the care plan section) Progress towards PT goals: Progressing toward goals    Frequency    Min 3X/week      PT Plan      Co-evaluation              AM-PAC PT "6 Clicks" Mobility   Outcome Measure  Help needed turning from your back to your side while in a flat bed without using bedrails?: None Help needed moving from lying on your back to sitting on the side of a flat bed without using bedrails?: None Help needed moving to and from a bed to a chair (including a wheelchair)?: A Little Help needed standing up from a chair using your arms (e.g., wheelchair or bedside chair)?: A Little Help needed to walk in hospital room?: A Little Help  needed climbing 3-5 steps with a railing? : A Lot 6 Click Score: 19    End of Session Equipment Utilized During Treatment: Oxygen Activity Tolerance: Patient tolerated treatment well Patient left: in bed;with call bell/phone within reach Nurse Communication: Mobility status PT Visit Diagnosis: Unsteadiness on feet (R26.81);Muscle weakness (generalized) (M62.81);Difficulty in walking, not elsewhere classified (R26.2)     Time: 1520-1550 PT Time Calculation (min) (ACUTE ONLY): 30 min  Charges:  $Gait Training: 8-22 mins $Therapeutic Exercise: 8-22 mins                     Blanchard Kelch PT Acute Rehabilitation Services Pager [phone] Office [phone]    Rada Hay 03/03/2020, 4:09 PM

## 2020-03-03 NOTE — Progress Notes (Signed)
PROGRESS NOTE    Bonnie Alexander    Code Status: Full Code  BDZ:[id number] DOB: 1951-06-01 DOA: 02/25/2020 LOS: 7 days  PCP: Daisy Floro, MD CC:  Chief Complaint  Patient presents with  . COVID Positive  . Low Oxygen Saturation       Hospital Summary   This is a 69 year old female with history of OSA, anxiety, fibromyalgia, GERD, hyperlipidemia who presented with about 10 days of respiratory symptoms and confirmed diagnosis of COVID-19.  She was going to have outpatient infusion for Covid but was noted to be hypoxemic and was sent to the ED for admission.  Has since been requiring 4 L O2, room air at baseline  4/15: overnight with increased O2 requirements. CXR ordered in AM: small loculated right pneumothorax. PCCM notified who stated she is not a candidate for chest tube at this time since it is small but may need a chest tube if it gets larger.   A & P   Active Problems:   Acute hypoxemic respiratory failure (HCC)   COVID-19 virus infection   Hypoxia   1. Acute hypoxic respiratory failure secondary to COVID-19 pneumonia as well as new small right loculated pneumothorax a. Improved O2 requirements now at 2 L/min b. Discussed pneumothorax with PCCM on 4/15.  Patient not a candidate for chest tube at this time due to small pneumothorax but may need chest tube if it enlarges. c. Follow-up CXR this morning with stable small inferior lateral right pneumothorax without tension d. S/p 5 days remdesivir, 8/10 days dexamethasone, s/p Actemra e. Continue Combivent, dexamethasone, flutter valve and incentive spirometry, encourage pronation as tolerated f. Continue vitamins and zinc g. Leukocytosis likely reactive to pneumothorax and Covid and steroids.  Improving  2. GERD Maalox as needed and Pepcid daily  3. Asymptomatic NSVT resolved  4. Hyperlipidemia  a. LDL 41 b. Not on any medications  5. History of fibromyalgia not on any medications  6. Polycythemia, likely secondary  to hemoconcentration a. Resolved with IV fluids  DVT prophylaxis: Lovenox Family Communication: Husband updated Disposition Plan:  Status is: Inpatient.  Continue to observe and monitor pneumothorax as well as O2 requirements  Remains inpatient appropriate because:Inpatient level of care appropriate due to severity of illness   Dispo: The patient is from: Home              Anticipated d/c is to: Home              Anticipated d/c date is: 2 days              Patient currently is not medically stable to d/c.    Pressure injury documentation    None  Consultants  Discussed CXR findings with PCCM as above  Procedures  None  Antibiotics   Anti-infectives (From admission, onward)   Start     Dose/Rate Route Frequency Ordered Stop   02/26/20 1000  remdesivir 100 mg in sodium chloride 0.9 % 100 mL IVPB  Status:  Discontinued     100 mg 200 mL/hr over 30 Minutes Intravenous Daily 02/25/20 2009 02/25/20 2022   02/26/20 1000  remdesivir 100 mg in sodium chloride 0.9 % 100 mL IVPB     100 mg 200 mL/hr over 30 Minutes Intravenous Daily 02/25/20 1837 02/29/20 0935   02/25/20 2145  remdesivir 100 mg in sodium chloride 0.9 % 100 mL IVPB     100 mg 200 mL/hr over 30 Minutes Intravenous Every hour 02/25/20 2053 02/26/20  0000   02/25/20 2100  trimethoprim (TRIMPEX) tablet 100 mg     100 mg Oral Daily 02/25/20 2009     02/25/20 2008  remdesivir 200 mg in sodium chloride 0.9% 250 mL IVPB  Status:  Discontinued     200 mg 580 mL/hr over 30 Minutes Intravenous Once 02/25/20 2009 02/25/20 2022   02/25/20 2000  remdesivir 100 mg in sodium chloride 0.9 % 100 mL IVPB  Status:  Discontinued     100 mg 200 mL/hr over 30 Minutes Intravenous Every hour 02/25/20 1837 02/25/20 2057        Subjective   Patient without any acute complaints.  No overnight events.  Objective   Vitals:   03/03/20 0100 03/03/20 0512 03/03/20 1230 03/03/20 1436  BP:  115/70  124/71  Pulse:    99  Resp:  17  18   Temp:  98.1 F (36.7 C)  97.7 F (36.5 C)  TempSrc:  Oral  Oral  SpO2: 95% 90% 95% 90%  Weight:      Height:        Intake/Output Summary (Last 24 hours) at 03/03/2020 1443 Last data filed at 03/02/2020 1900 Gross per 24 hour  Intake 120 ml  Output --  Net 120 ml   Filed Weights   02/25/20 1229 02/25/20 2010  Weight: 70.3 kg 65.9 kg    Examination:  Physical Exam Vitals and nursing note reviewed.  Constitutional:      Appearance: Normal appearance.  HENT:     Head: Normocephalic and atraumatic.  Eyes:     Conjunctiva/sclera: Conjunctivae normal.  Cardiovascular:     Rate and Rhythm: Normal rate and regular rhythm.  Pulmonary:     Effort: Pulmonary effort is normal. No respiratory distress.     Comments: On nasal cannula Abdominal:     General: Abdomen is flat.     Palpations: Abdomen is soft.  Musculoskeletal:        General: No swelling or tenderness.  Skin:    Coloration: Skin is not jaundiced or pale.  Neurological:     Mental Status: She is alert. Mental status is at baseline.  Psychiatric:        Mood and Affect: Mood normal.        Behavior: Behavior normal.     Data Reviewed: I have personally reviewed following labs and imaging studies  CBC: Recent Labs  Lab 02/26/20 0034 02/26/20 0034 02/27/20 0410 02/27/20 0410 02/28/20 0402 02/29/20 0441 03/01/20 0503 03/02/20 0432 03/03/20 0443  WBC 8.6   < > 8.1   < > 10.5 9.4 14.7* 11.1* 11.5*  NEUTROABS 7.0  --  5.9  --  8.1* 7.1 10.5*  --   --   HGB 14.6   < > 14.1   < > 14.1 13.8 15.1* 13.4 13.8  HCT 44.8   < > 43.3   < > 42.6 43.3 48.3* 41.5 42.5  MCV 85.3   < > 85.2   < > 85.2 87.5 89.1 87.6 87.3  PLT 194   < > 237   < > 292 327 454* 289 282   < > = values in this interval not displayed.   Basic Metabolic Panel: Recent Labs  Lab 02/26/20 0034 02/26/20 0034 02/27/20 0410 02/27/20 0410 02/28/20 0402 02/29/20 0441 03/01/20 0503 03/02/20 0432 03/03/20 0443  NA 135   < > 139   < > 137  141 136 138 136  K 3.6   < >  3.9   < > 3.9 4.5 4.1 4.5 4.7  CL 99   < > 103   < > 103 109 105 105 104  CO2 21*   < > 23   < > 24 20* 20* 23 24  GLUCOSE 131*   < > 166*   < > 171* 179* 135* 140* 134*  BUN 16   < > 22   < > 23 21 21 21 20   CREATININE 0.72   < > 0.61   < > 0.75 0.72 0.78 0.76 0.64  CALCIUM 8.6*   < > 9.0   < > 8.4* 8.8* 8.6* 8.3* 8.5*  MG 2.3   < > 2.7*  --  2.4 2.3 2.4 2.3  --   PHOS 2.7  --  2.5  --  2.6 3.3 3.6  --   --    < > = values in this interval not displayed.   GFR: Estimated Creatinine Clearance: 60.1 mL/min (by C-G formula based on SCr of 0.64 mg/dL). Liver Function Tests: Recent Labs  Lab 02/26/20 0034 02/27/20 0410 02/28/20 0402 02/29/20 0441 03/01/20 0503  AST 58* 52* 41 39 44*  ALT 34 34 34 34 43  ALKPHOS 44 45 49 47 56  BILITOT 1.4* 0.7 0.8 0.6 0.8  PROT 7.1 7.0 6.4* 6.0* 6.6  ALBUMIN 3.4* 3.2* 3.1* 2.9* 3.2*   No results for input(s): LIPASE, AMYLASE in the last 168 hours. No results for input(s): AMMONIA in the last 168 hours. Coagulation Profile: No results for input(s): INR, PROTIME in the last 168 hours. Cardiac Enzymes: No results for input(s): CKTOTAL, CKMB, CKMBINDEX, TROPONINI in the last 168 hours. BNP (last 3 results) No results for input(s): PROBNP in the last 8760 hours. HbA1C: No results for input(s): HGBA1C in the last 72 hours. CBG: Recent Labs  Lab 03/02/20 1713 03/02/20 2153 03/03/20 0810 03/03/20 1138 03/03/20 1159  GLUCAP 173* 187* 106* 102* 112*   Lipid Profile: Recent Labs    03/01/20 0503  CHOL 94  HDL 18*  LDLCALC 41  TRIG 177*  CHOLHDL 5.2   Thyroid Function Tests: No results for input(s): TSH, T4TOTAL, FREET4, T3FREE, THYROIDAB in the last 72 hours. Anemia Panel: Recent Labs    03/01/20 0503  FERRITIN 1,478*   Sepsis Labs: Recent Labs  Lab 02/25/20 2045  PROCALCITON <0.10    Recent Results (from the past 240 hour(s))  Blood Culture (routine x 2)     Status: None   Collection Time:  02/25/20 12:38 PM   Specimen: BLOOD  Result Value Ref Range Status   Specimen Description BLOOD SITE NOT SPECIFIED  Final   Special Requests   Final    BOTTLES DRAWN AEROBIC AND ANAEROBIC Blood Culture results may not be optimal due to an inadequate volume of blood received in culture bottles Performed at [facility], [place] [address] [place], [place] [address]    Culture NO GROWTH 5 DAYS  Final   Report Status 03/01/2020 FINAL  Final  Blood Culture (routine x 2)     Status: None   Collection Time: 02/25/20 12:39 PM   Specimen: BLOOD LEFT HAND  Result Value Ref Range Status   Specimen Description BLOOD LEFT HAND  Final   Special Requests   Final    BOTTLES DRAWN AEROBIC AND ANAEROBIC Blood Culture adequate volume Performed at [facility] [address], [place],  [address]    Culture NO GROWTH 5 DAYS  Final  Report Status 03/01/2020 FINAL  Final         Radiology Studies: DG CHEST PORT 1 VIEW  Result Date: 03/03/2020 CLINICAL DATA:  Pneumothorax EXAM: PORTABLE CHEST 1 VIEW COMPARISON:  March 02, 2020 FINDINGS: There is a stable small inferior, lateral right pneumothorax without tension component. There is mild bibasilar atelectasis. Lungs elsewhere are clear. Heart size and pulmonary vascularity are normal. No adenopathy. No bone lesions. IMPRESSION: Stable small inferolateral right pneumothorax without tension component. Mild bibasilar atelectasis. No edema or airspace opacity. Stable cardiac silhouette. Electronically Signed   By: Bretta Bang III M.D.   On: 03/03/2020 11:21   DG CHEST PORT 1 VIEW  Result Date: 03/02/2020 CLINICAL DATA:  Follow-up RIGHT basilar pneumothorax. COVID-19 positive patient. EXAM: PORTABLE CHEST 1 VIEW 3:52 p.m.: COMPARISON:  Portable chest x-ray earlier same day at 11:28 a.m. and previously. FINDINGS: The vertically oriented linear opacity at the RIGHT lung base which may represent a small loculated  pneumothorax is unchanged since the examination earlier today. The streaky opacities at the LEFT lung base are also unchanged. No new pulmonary parenchymal abnormalities. Cardiac silhouette normal in size. Pulmonary vascularity normal. No visible pleural effusions. IMPRESSION: 1. Possible small loculated pneumothorax at the RIGHT lung base, unchanged since the examination earlier today. 2. Stable streaky atelectasis and/or pneumonia at the LEFT lung base. 3. No new abnormalities. Electronically Signed   By: Hulan Saas M.D.   On: 03/02/2020 18:05   DG CHEST PORT 1 VIEW  Addendum Date: 03/02/2020   ADDENDUM REPORT: 03/02/2020 12:31 ADDENDUM: Critical Value/emergent results were called by telephone at the time of interpretation on 03/02/2020 at 11:57 a.m. to provider Ercia Crisafulli , who verbally acknowledged these results. Electronically Signed   By: Elberta Fortis M.D.   On: 03/02/2020 12:31   Result Date: 03/02/2020 CLINICAL DATA:  Hypoxia.  COVID-19 positive. EXAM: PORTABLE CHEST 1 VIEW COMPARISON:  02/25/2020 FINDINGS: Examination demonstrates interval development of small loculated lateral right basilar pneumothorax. Subtle persistent hazy density over the right base and lateral left base. Cardiomediastinal silhouette and remainder the exam is unchanged. IMPRESSION: Interval development of loculated small right lateral basilar pneumothorax. Subtle hazy bibasilar opacification which may be due to infection versus atelectasis. Electronically Signed: By: Elberta Fortis M.D. On: 03/02/2020 11:49        Scheduled Meds: . vitamin C  500 mg Oral Daily  . calcium carbonate  1 tablet Oral Q breakfast  . dexamethasone  6 mg Oral Daily  . enoxaparin (LOVENOX) injection  40 mg Subcutaneous Q24H  . famotidine  10 mg Oral Daily  . fluticasone  1 spray Each Nare Daily  . insulin aspart  0-9 Units Subcutaneous TID WC  . Ipratropium-Albuterol  1 puff Inhalation Q6H  . multivitamin with minerals  1 tablet  Oral Daily  . trimethoprim  100 mg Oral Daily  . zinc sulfate  220 mg Oral Daily   Continuous Infusions:   Time spent: 25 minutes with over 50% of the time coordinating the patient's care    Jae Dire, DO [facility] Pager [phone]  Call night coverage person covering after 7pm

## 2020-03-04 ENCOUNTER — Inpatient Hospital Stay (HOSPITAL_COMMUNITY): Payer: Medicare Other

## 2020-03-04 DIAGNOSIS — J9601 Acute respiratory failure with hypoxia: Secondary | ICD-10-CM | POA: Diagnosis not present

## 2020-03-04 LAB — GLUCOSE, CAPILLARY
Glucose-Capillary: 91 mg/dL (ref 70–99)
Glucose-Capillary: 123 mg/dL — ABNORMAL HIGH (ref 70–99)
Glucose-Capillary: 147 mg/dL — ABNORMAL HIGH (ref 70–99)
Glucose-Capillary: 168 mg/dL — ABNORMAL HIGH (ref 70–99)

## 2020-03-04 NOTE — Progress Notes (Signed)
PROGRESS NOTE    Bonnie Alexander    Code Status: Full Code  ENM:[id number] DOB: 04/06/1951 DOA: 02/25/2020 LOS: 8 days  PCP: Daisy Floro, MD CC:  Chief Complaint  Patient presents with  . COVID Positive  . Low Oxygen Saturation       Hospital Summary   This is a 69 year old female with history of OSA, anxiety, fibromyalgia, GERD, hyperlipidemia who presented with about 10 days of respiratory symptoms and confirmed diagnosis of COVID-19.  She was going to have outpatient infusion for Covid but was noted to be hypoxemic and was sent to the ED for admission.  Has since been requiring 4 L O2, room air at baseline  4/15: overnight with increased O2 requirements. CXR ordered in AM: small loculated right pneumothorax. PCCM notified who stated she is not a candidate for chest tube at this time since it is small but may need a chest tube if it gets larger.   A & P   Active Problems:   Acute hypoxemic respiratory failure (HCC)   COVID-19 virus infection   Hypoxia   1. Acute hypoxic respiratory failure secondary to COVID-19 pneumonia as well as new small right loculated pneumothorax a. Increased O2 requirements up to 4 L/min at rest b. Follow-up CXR this morning with stable small inferior lateral right pneumothorax without tension c. S/p 5 days remdesivir, 9/10 days dexamethasone, s/p Actemra d. Continue Combivent, dexamethasone, flutter valve and incentive spirometry, encourage pronation as tolerated e. Continue vitamins and zinc  2. GERD Maalox as needed and Pepcid daily  3. Asymptomatic NSVT resolved  4. Hyperlipidemia  a. LDL 41 b. Not on any medications  5. History of fibromyalgia not on any medications  6. Polycythemia, likely secondary to hemoconcentration a. Resolved with IV fluids  DVT prophylaxis: Lovenox Family Communication: Husband updated yesterday Disposition Plan:  Status is: Inpatient.  Continues to remain inpatient due to increasing O2 requirements today  and persistent pneumothorax  Remains inpatient appropriate because:Inpatient level of care appropriate due to severity of illness   Dispo: The patient is from: Home              Anticipated d/c is to: Home              Anticipated d/c date is: 2 days              Patient currently is not medically stable to d/c.    Pressure injury documentation    None  Consultants  Discussed CXR findings with PCCM   Procedures  None  Antibiotics   Anti-infectives (From admission, onward)   Start     Dose/Rate Route Frequency Ordered Stop   02/26/20 1000  remdesivir 100 mg in sodium chloride 0.9 % 100 mL IVPB  Status:  Discontinued     100 mg 200 mL/hr over 30 Minutes Intravenous Daily 02/25/20 2009 02/25/20 2022   02/26/20 1000  remdesivir 100 mg in sodium chloride 0.9 % 100 mL IVPB     100 mg 200 mL/hr over 30 Minutes Intravenous Daily 02/25/20 1837 02/29/20 0935   02/25/20 2145  remdesivir 100 mg in sodium chloride 0.9 % 100 mL IVPB     100 mg 200 mL/hr over 30 Minutes Intravenous Every hour 02/25/20 2053 02/26/20 0000   02/25/20 2100  trimethoprim (TRIMPEX) tablet 100 mg     100 mg Oral Daily 02/25/20 2009     02/25/20 2008  remdesivir 200 mg in sodium chloride 0.9% 250 mL IVPB  Status:  Discontinued     200 mg 580 mL/hr over 30 Minutes Intravenous Once 02/25/20 2009 02/25/20 2022   02/25/20 2000  remdesivir 100 mg in sodium chloride 0.9 % 100 mL IVPB  Status:  Discontinued     100 mg 200 mL/hr over 30 Minutes Intravenous Every hour 02/25/20 1837 02/25/20 2057        Subjective   Denies any acute complaints and without any overnight events.  Denies any shortness of breath.  Frustrated she still hospitalized  Objective   Vitals:   03/04/20 0610 03/04/20 0700 03/04/20 0701 03/04/20 1427  BP:    112/75  Pulse: 76 60 61 95  Resp: 17 (!) 21 20 10   Temp:    98.2 F (36.8 C)  TempSrc:    Oral  SpO2: 91% 92% 91% 90%  Weight:      Height:        Intake/Output Summary (Last  24 hours) at 03/04/2020 1443 Last data filed at 03/04/2020 1300 Gross per 24 hour  Intake 840 ml  Output --  Net 840 ml   Filed Weights   02/25/20 1229 02/25/20 2010 03/04/20 0500  Weight: 70.3 kg 65.9 kg 68.3 kg    Examination:  Physical Exam Vitals and nursing note reviewed.  Constitutional:      General: She is not in acute distress. Eyes:     Conjunctiva/sclera: Conjunctivae normal.  Cardiovascular:     Rate and Rhythm: Normal rate.  Pulmonary:     Effort: Pulmonary effort is normal. No respiratory distress.  Abdominal:     General: Abdomen is flat. There is no distension.  Musculoskeletal:        General: No swelling or tenderness.  Neurological:     Mental Status: She is alert. Mental status is at baseline.  Psychiatric:        Mood and Affect: Mood normal.        Behavior: Behavior normal.     Data Reviewed: I have personally reviewed following labs and imaging studies  CBC: Recent Labs  Lab 02/27/20 0410 02/27/20 0410 02/28/20 0402 02/29/20 0441 03/01/20 0503 03/02/20 0432 03/03/20 0443  WBC 8.1   < > 10.5 9.4 14.7* 11.1* 11.5*  NEUTROABS 5.9  --  8.1* 7.1 10.5*  --   --   HGB 14.1   < > 14.1 13.8 15.1* 13.4 13.8  HCT 43.3   < > 42.6 43.3 48.3* 41.5 42.5  MCV 85.2   < > 85.2 87.5 89.1 87.6 87.3  PLT 237   < > 292 327 454* 289 282   < > = values in this interval not displayed.   Basic Metabolic Panel: Recent Labs  Lab 02/27/20 0410 02/27/20 0410 02/28/20 0402 02/29/20 0441 03/01/20 0503 03/02/20 0432 03/03/20 0443  NA 139   < > 137 141 136 138 136  K 3.9   < > 3.9 4.5 4.1 4.5 4.7  CL 103   < > 103 109 105 105 104  CO2 23   < > 24 20* 20* 23 24  GLUCOSE 166*   < > 171* 179* 135* 140* 134*  BUN 22   < > 23 21 21 21 20   CREATININE 0.61   < > 0.75 0.72 0.78 0.76 0.64  CALCIUM 9.0   < > 8.4* 8.8* 8.6* 8.3* 8.5*  MG 2.7*  --  2.4 2.3 2.4 2.3  --   PHOS 2.5  --  2.6 3.3 3.6  --   --    < > =  values in this interval not displayed.    GFR: Estimated Creatinine Clearance: 61.2 mL/min (by C-G formula based on SCr of 0.64 mg/dL). Liver Function Tests: Recent Labs  Lab 02/27/20 0410 02/28/20 0402 02/29/20 0441 03/01/20 0503  AST 52* 41 39 44*  ALT 34 34 34 43  ALKPHOS 45 49 47 56  BILITOT 0.7 0.8 0.6 0.8  PROT 7.0 6.4* 6.0* 6.6  ALBUMIN 3.2* 3.1* 2.9* 3.2*   No results for input(s): LIPASE, AMYLASE in the last 168 hours. No results for input(s): AMMONIA in the last 168 hours. Coagulation Profile: No results for input(s): INR, PROTIME in the last 168 hours. Cardiac Enzymes: No results for input(s): CKTOTAL, CKMB, CKMBINDEX, TROPONINI in the last 168 hours. BNP (last 3 results) No results for input(s): PROBNP in the last 8760 hours. HbA1C: No results for input(s): HGBA1C in the last 72 hours. CBG: Recent Labs  Lab 03/03/20 1159 03/03/20 1605 03/03/20 2101 03/04/20 0746 03/04/20 1149  GLUCAP 112* 196* 160* 91 123*   Lipid Profile: No results for input(s): CHOL, HDL, LDLCALC, TRIG, CHOLHDL, LDLDIRECT in the last 72 hours. Thyroid Function Tests: No results for input(s): TSH, T4TOTAL, FREET4, T3FREE, THYROIDAB in the last 72 hours. Anemia Panel: No results for input(s): VITAMINB12, FOLATE, FERRITIN, TIBC, IRON, RETICCTPCT in the last 72 hours. Sepsis Labs: No results for input(s): PROCALCITON, LATICACIDVEN in the last 168 hours.  Recent Results (from the past 240 hour(s))  Blood Culture (routine x 2)     Status: None   Collection Time: 02/25/20 12:38 PM   Specimen: BLOOD  Result Value Ref Range Status   Specimen Description BLOOD SITE NOT SPECIFIED  Final   Special Requests   Final    BOTTLES DRAWN AEROBIC AND ANAEROBIC Blood Culture results may not be optimal due to an inadequate volume of blood received in culture bottles Performed at [facility], [address], [place], [place] [address]    Culture NO GROWTH 5 DAYS  Final   Report Status 03/01/2020 FINAL  Final  Blood  Culture (routine x 2)     Status: None   Collection Time: 02/25/20 12:39 PM   Specimen: BLOOD LEFT HAND  Result Value Ref Range Status   Specimen Description BLOOD LEFT HAND  Final   Special Requests   Final    BOTTLES DRAWN AEROBIC AND ANAEROBIC Blood Culture adequate volume Performed at [facility], [address], [place], [place] [address]    Culture NO GROWTH 5 DAYS  Final   Report Status 03/01/2020 FINAL  Final         Radiology Studies: DG CHEST PORT 1 VIEW  Result Date: 03/04/2020 CLINICAL DATA:  COVID-19 positive, dyspnea EXAM: PORTABLE CHEST 1 VIEW COMPARISON:  03/03/2020 chest radiograph. FINDINGS: Stable cardiomediastinal silhouette with normal heart size. Small lateral basilar right pneumothorax is unchanged. No left pneumothorax. No mediastinal shift. No pleural effusion. No pulmonary edema. Minimal hazy bibasilar lung opacities are unchanged. IMPRESSION: 1. Stable small lateral basilar right pneumothorax. No mediastinal shift. 2. Stable minimal hazy bibasilar lung opacities. Electronically Signed   By: Delbert Phenix M.D.   On: 03/04/2020 12:00   DG CHEST PORT 1 VIEW  Result Date: 03/03/2020 CLINICAL DATA:  Pneumothorax EXAM: PORTABLE CHEST 1 VIEW COMPARISON:  March 02, 2020 FINDINGS: There is a stable small inferior, lateral right pneumothorax without tension component. There is mild bibasilar atelectasis. Lungs elsewhere are clear. Heart size and pulmonary vascularity are normal. No adenopathy. No bone  lesions. IMPRESSION: Stable small inferolateral right pneumothorax without tension component. Mild bibasilar atelectasis. No edema or airspace opacity. Stable cardiac silhouette. Electronically Signed   By: Bretta Bang III M.D.   On: 03/03/2020 11:21   DG CHEST PORT 1 VIEW  Result Date: 03/02/2020 CLINICAL DATA:  Follow-up RIGHT basilar pneumothorax. COVID-19 positive patient. EXAM: PORTABLE CHEST 1 VIEW 3:52 p.m.: COMPARISON:  Portable chest x-ray  earlier same day at 11:28 a.m. and previously. FINDINGS: The vertically oriented linear opacity at the RIGHT lung base which may represent a small loculated pneumothorax is unchanged since the examination earlier today. The streaky opacities at the LEFT lung base are also unchanged. No new pulmonary parenchymal abnormalities. Cardiac silhouette normal in size. Pulmonary vascularity normal. No visible pleural effusions. IMPRESSION: 1. Possible small loculated pneumothorax at the RIGHT lung base, unchanged since the examination earlier today. 2. Stable streaky atelectasis and/or pneumonia at the LEFT lung base. 3. No new abnormalities. Electronically Signed   By: Hulan Saas M.D.   On: 03/02/2020 18:05        Scheduled Meds: . vitamin C  500 mg Oral Daily  . calcium carbonate  1 tablet Oral Q breakfast  . dexamethasone  6 mg Oral Daily  . enoxaparin (LOVENOX) injection  40 mg Subcutaneous Q24H  . famotidine  10 mg Oral Daily  . fluticasone  1 spray Each Nare Daily  . insulin aspart  0-9 Units Subcutaneous TID WC  . Ipratropium-Albuterol  1 puff Inhalation Q6H  . multivitamin with minerals  1 tablet Oral Daily  . trimethoprim  100 mg Oral Daily  . zinc sulfate  220 mg Oral Daily   Continuous Infusions:   Time spent: 20 minutes with over 50% of the time coordinating the patient's care    Jae Dire, DO [facility] Pager [phone]  Call night coverage person covering after 7pm

## 2020-03-04 NOTE — Plan of Care (Signed)
Discussed with patient plan of care for the evening, pain management, ambulation and activities with slower movements with some teach back displayed.  Patient updating husband and sister at this time.  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

## 2020-03-05 DIAGNOSIS — J9601 Acute respiratory failure with hypoxia: Secondary | ICD-10-CM | POA: Diagnosis not present

## 2020-03-05 DIAGNOSIS — J939 Pneumothorax, unspecified: Secondary | ICD-10-CM | POA: Diagnosis not present

## 2020-03-05 LAB — BASIC METABOLIC PANEL
Anion gap: 10 (ref 5–15)
BUN: 26 mg/dL — ABNORMAL HIGH (ref 8–23)
CO2: 26 mmol/L (ref 22–32)
Calcium: 8.6 mg/dL — ABNORMAL LOW (ref 8.9–10.3)
Chloride: 100 mmol/L (ref 98–111)
Creatinine, Ser: 0.7 mg/dL (ref 0.44–1.00)
GFR calc Af Amer: >60 mL/min (ref >60)
GFR calc non Af Amer: >60 mL/min (ref >60)
Glucose, Bld: 102 mg/dL — ABNORMAL HIGH (ref 70–99)
Potassium: 4.4 mmol/L (ref 3.5–5.1)
Sodium: 136 mmol/L (ref 135–145)

## 2020-03-05 LAB — GLUCOSE, CAPILLARY
Glucose-Capillary: 93 mg/dL (ref 70–99)
Glucose-Capillary: 102 mg/dL — ABNORMAL HIGH (ref 70–99)
Glucose-Capillary: 142 mg/dL — ABNORMAL HIGH (ref 70–99)
Glucose-Capillary: 126 mg/dL — ABNORMAL HIGH (ref 70–99)

## 2020-03-05 LAB — CBC
HCT: 45.2 % (ref 36.0–46.0)
Hemoglobin: 14.3 g/dL (ref 12.0–15.0)
MCH: 27.7 pg (ref 26.0–34.0)
MCHC: 31.6 g/dL (ref 30.0–36.0)
MCV: 87.6 fL (ref 80.0–100.0)
Platelets: 316 10*3/uL (ref 150–400)
RBC: 5.16 MIL/uL — ABNORMAL HIGH (ref 3.87–5.11)
RDW: 13.1 % (ref 11.5–15.5)
WBC: 10.8 10*3/uL — ABNORMAL HIGH (ref 4.0–10.5)
nRBC: 0 % (ref 0.0–0.2)

## 2020-03-05 NOTE — Plan of Care (Signed)
Discussed with patient plan of care for the evening, pain management and as needed medications with some teach back displayed.  Patient updating family herself at this time.  Discussed laying on her left side tonight with extra pillows behind her.

## 2020-03-05 NOTE — Progress Notes (Signed)
Physical Therapy Treatment Patient Details Name: Bonnie Alexander MRN: [id number] DOB: 1951/06/08 Today's Date: 03/05/2020    History of Present Illness 69 yo female admitted with COVID Pna. Hx of fibromyalgia, vertigo. small pneumothorax, not candidate for CT at this time per CCM    PT Comments    Pt fatigued from being up chair, short distance amb, agreeable to exercises. Left in prone position. Will benefit from HHPT and continued PT in acute setting.  See below for activity and VS  Follow Up Recommendations  Home health PT     Equipment Recommendations  Rolling walker with 5" wheels    Recommendations for Other Services       Precautions / Restrictions Precautions Precautions: Fall Precaution Comments: monitor sats    Mobility  Bed Mobility Overal bed mobility: Needs Assistance Bed Mobility: Sit to Supine       Sit to supine: Supervision   General bed mobility comments: for safety and line management. pt to prone position, rationale discussed   Transfers Overall transfer level: Needs assistance Equipment used: None Transfers: Sit to/from Stand Sit to Stand: Min guard;Supervision         General transfer comment: for safety  Ambulation/Gait Ambulation/Gait assistance: Min Emergency planning/management officer (Feet): 15 Feet Assistive device: None Gait Pattern/deviations: Step-through pattern;Decreased stride length     General Gait Details: slow but steady, pt fatgiued and declined incr distance; SpO2= 93-96% on 3.5L, HR 97-110, RR 15-24   Stairs             Wheelchair Mobility    Modified Rankin (Stroke Patients Only)       Balance                                            Cognition Arousal/Alertness: Awake/alert Behavior During Therapy: WFL for tasks assessed/performed Overall Cognitive Status: Within Functional Limits for tasks assessed                                 General Comments: very pleasant  and cooperative       Exercises General Exercises - Lower Extremity Ankle Circles/Pumps: Other (comment)(encouraged) Long Arc Quad: AROM;Seated;Both;15 reps Hip Flexion/Marching: AROM;Both;15 reps Toe Raises: AROM;Both;15 reps;Seated Other Exercises Other Exercises: reviewed postural exercises/ scapular retraction/shoulder abduction. pt reports using TBand when she can't sleep     General Comments        Pertinent Vitals/Pain Pain Assessment: No/denies pain    Home Living                      Prior Function            PT Goals (current goals can now be found in the care plan section) Acute Rehab PT Goals Patient Stated Goal: to get better PT Goal Formulation: With patient Time For Goal Achievement: 03/14/20 Potential to Achieve Goals: Good Progress towards PT goals: Progressing toward goals    Frequency    Min 3X/week      PT Plan Current plan remains appropriate    Co-evaluation              AM-PAC PT "6 Clicks" Mobility   Outcome Measure  Help needed turning from your back to your side while in a flat bed without using bedrails?: None Help needed  moving from lying on your back to sitting on the side of a flat bed without using bedrails?: None Help needed moving to and from a bed to a chair (including a wheelchair)?: A Little Help needed standing up from a chair using your arms (e.g., wheelchair or bedside chair)?: A Little Help needed to walk in hospital room?: A Little Help needed climbing 3-5 steps with a railing? : A Lot 6 Click Score: 19    End of Session Equipment Utilized During Treatment: Oxygen Activity Tolerance: Patient tolerated treatment well Patient left: in bed;with call bell/phone within reach Nurse Communication: Mobility status PT Visit Diagnosis: Unsteadiness on feet (R26.81);Muscle weakness (generalized) (M62.81);Difficulty in walking, not elsewhere classified (R26.2)     Time: 9244-6286 PT Time Calculation (min)  (ACUTE ONLY): 24 min  Charges:  $Therapeutic Exercise: 8-22 mins $Therapeutic Activity: 8-22 mins                     Baxter Flattery, PT   Acute Rehab Dept [facility]: [place] Rhonda W): [phone]   03/05/2020    [facility] 03/05/2020, 3:34 PM

## 2020-03-05 NOTE — Plan of Care (Signed)
  Problem: Education: Goal: Knowledge of risk factors and measures for prevention of condition will improve Outcome: Progressing   Problem: Coping: Goal: Psychosocial and spiritual needs will be supported Outcome: Progressing   Problem: Respiratory: Goal: Will maintain a patent airway Outcome: Progressing Goal: Complications related to the disease process, condition or treatment will be avoided or minimized Outcome: Progressing   

## 2020-03-05 NOTE — Progress Notes (Signed)
PROGRESS NOTE    Bonnie Alexander    Code Status: Full Code  UMP:[id number] DOB: September 01, 1951 DOA: 02/25/2020 LOS: 9 days  PCP: Lawerance Cruel, MD CC:  Chief Complaint  Patient presents with  . COVID Positive  . [facility] Summary   This is a 69 year old female with history of OSA, anxiety, fibromyalgia, GERD, hyperlipidemia who presented with about 10 days of respiratory symptoms and confirmed diagnosis of COVID-19.  She was going to have outpatient infusion for Covid but was noted to be hypoxemic and was sent to the ED for admission.  Has since been requiring 4 L O2, room air at baseline  4/15: overnight with increased O2 requirements. CXR ordered in AM: small loculated right pneumothorax. PCCM notified who stated she is not a candidate for chest tube at this time since it is small but may need a chest tube if it gets larger.   A & P   Active Problems:   Acute hypoxemic respiratory failure (HCC)   COVID-19 virus infection   Hypoxia   1. Acute hypoxic respiratory failure secondary to COVID-19 pneumonia as well as new small right loculated pneumothorax a. Still requiring 4 L/min at rest with low 90s O2 sat and minimal conversational dyspnea today b. S/p 5 days remdesivir, 10/10 days dexamethasone, s/p Actemra c. Continue Combivent, dexamethasone, flutter valve and incentive spirometry, encourage pronation as tolerated d. Continue vitamins and zinc e. Try to avoid HFNC since she has a small pneumothorax and follow up cxr in am. Depending on results will consider reaching back out to pulm.  2. GERD Maalox as needed and Pepcid daily  3. Asymptomatic NSVT resolved  4. Hyperlipidemia  a. LDL 41 b. Not on any medications  5. History of fibromyalgia not on any medications  6. Polycythemia, likely secondary to hemoconcentration a. Resolved with IV fluids  DVT prophylaxis: Lovenox Family Communication: Husband updated today Disposition Plan:  Status  is: Inpatient.  Continues to remain inpatient due to increasing O2 requirements today and persistent pneumothorax  Remains inpatient appropriate because:Inpatient level of care appropriate due to severity of illness   Dispo: The patient is from: Home              Anticipated d/c is to: Home              Anticipated d/c date is: 2 days              Patient currently is not medically stable to d/c.    Pressure injury documentation    None  Consultants  Discussed CXR findings with PCCM   Procedures  None  Antibiotics   Anti-infectives (From admission, onward)   Start     Dose/Rate Route Frequency Ordered Stop   02/26/20 1000  remdesivir 100 mg in sodium chloride 0.9 % 100 mL IVPB  Status:  Discontinued     100 mg 200 mL/hr over 30 Minutes Intravenous Daily 02/25/20 2009 02/25/20 2022   02/26/20 1000  remdesivir 100 mg in sodium chloride 0.9 % 100 mL IVPB     100 mg 200 mL/hr over 30 Minutes Intravenous Daily 02/25/20 1837 02/29/20 0935   02/25/20 2145  remdesivir 100 mg in sodium chloride 0.9 % 100 mL IVPB     100 mg 200 mL/hr over 30 Minutes Intravenous Every hour 02/25/20 2053 02/26/20 0000   02/25/20 2100  trimethoprim (TRIMPEX) tablet 100 mg     100 mg Oral  Daily 02/25/20 2009     02/25/20 2008  remdesivir 200 mg in sodium chloride 0.9% 250 mL IVPB  Status:  Discontinued     200 mg 580 mL/hr over 30 Minutes Intravenous Once 02/25/20 2009 02/25/20 2022   02/25/20 2000  remdesivir 100 mg in sodium chloride 0.9 % 100 mL IVPB  Status:  Discontinued     100 mg 200 mL/hr over 30 Minutes Intravenous Every hour 02/25/20 1837 02/25/20 2057        Subjective   Had just come back from the bathroom and had some shortness of breath sitting on the edge of the bed. Satting at 90-92% on 4L/min. No other complaints or overnight events   Objective   Vitals:   03/05/20 0511 03/05/20 0600 03/05/20 0700 03/05/20 1128  BP:    120/69  Pulse: 62 73 (!) 52 80  Resp: 18 (!) 22 19 18     Temp:    (!) 97.5 F (36.4 C)  TempSrc:    Oral  SpO2: 97% 94% 96% 96%  Weight:      Height:        Intake/Output Summary (Last 24 hours) at 03/05/2020 1335 Last data filed at 03/05/2020 1316 Gross per 24 hour  Intake 1150 ml  Output --  Net 1150 ml   Filed Weights   02/25/20 2010 03/04/20 0500 03/05/20 0309  Weight: 65.9 kg 68.3 kg 66.9 kg    Examination:  Physical Exam Vitals and nursing note reviewed.  Constitutional:      Appearance: Normal appearance.  HENT:     Head: Normocephalic and atraumatic.  Cardiovascular:     Rate and Rhythm: Regular rhythm. Tachycardia present.  Pulmonary:     Effort: Pulmonary effort is normal. No respiratory distress.     Breath sounds: No wheezing.  Musculoskeletal:        General: No swelling or tenderness.  Neurological:     Mental Status: She is alert. Mental status is at baseline.  Psychiatric:        Mood and Affect: Mood normal.        Behavior: Behavior normal.     Data Reviewed: I have personally reviewed following labs and imaging studies  CBC: Recent Labs  Lab 02/28/20 0402 02/28/20 0402 02/29/20 0441 03/01/20 0503 03/02/20 0432 03/03/20 0443 03/05/20 0453  WBC 10.5   < > 9.4 14.7* 11.1* 11.5* 10.8*  NEUTROABS 8.1*  --  7.1 10.5*  --   --   --   HGB 14.1   < > 13.8 15.1* 13.4 13.8 14.3  HCT 42.6   < > 43.3 48.3* 41.5 42.5 45.2  MCV 85.2   < > 87.5 89.1 87.6 87.3 87.6  PLT 292   < > 327 454* 289 282 316   < > = values in this interval not displayed.   Basic Metabolic Panel: Recent Labs  Lab 02/28/20 0402 02/28/20 0402 02/29/20 0441 03/01/20 0503 03/02/20 0432 03/03/20 0443 03/05/20 0453  NA 137   < > 141 136 138 136 136  K 3.9   < > 4.5 4.1 4.5 4.7 4.4  CL 103   < > 109 105 105 104 100  CO2 24   < > 20* 20* 23 24 26   GLUCOSE 171*   < > 179* 135* 140* 134* 102*  BUN 23   < > 21 21 21 20  26*  CREATININE 0.75   < > 0.72 0.78 0.76 0.64 0.70  CALCIUM 8.4*   < >  8.8* 8.6* 8.3* 8.5* 8.6*  MG 2.4  --   2.3 2.4 2.3  --   --   PHOS 2.6  --  3.3 3.6  --   --   --    < > = values in this interval not displayed.   GFR: Estimated Creatinine Clearance: 60.5 mL/min (by C-G formula based on SCr of 0.7 mg/dL). Liver Function Tests: Recent Labs  Lab 02/28/20 0402 02/29/20 0441 03/01/20 0503  AST 41 39 44*  ALT 34 34 43  ALKPHOS 49 47 56  BILITOT 0.8 0.6 0.8  PROT 6.4* 6.0* 6.6  ALBUMIN 3.1* 2.9* 3.2*   No results for input(s): LIPASE, AMYLASE in the last 168 hours. No results for input(s): AMMONIA in the last 168 hours. Coagulation Profile: No results for input(s): INR, PROTIME in the last 168 hours. Cardiac Enzymes: No results for input(s): CKTOTAL, CKMB, CKMBINDEX, TROPONINI in the last 168 hours. BNP (last 3 results) No results for input(s): PROBNP in the last 8760 hours. HbA1C: No results for input(s): HGBA1C in the last 72 hours. CBG: Recent Labs  Lab 03/04/20 1149 03/04/20 1729 03/04/20 2127 03/05/20 0747 03/05/20 1123  GLUCAP 123* 147* 168* 93 102*   Lipid Profile: No results for input(s): CHOL, HDL, LDLCALC, TRIG, CHOLHDL, LDLDIRECT in the last 72 hours. Thyroid Function Tests: No results for input(s): TSH, T4TOTAL, FREET4, T3FREE, THYROIDAB in the last 72 hours. Anemia Panel: No results for input(s): VITAMINB12, FOLATE, FERRITIN, TIBC, IRON, RETICCTPCT in the last 72 hours. Sepsis Labs: No results for input(s): PROCALCITON, LATICACIDVEN in the last 168 hours.  Recent Results (from the past 240 hour(s))  Blood Culture (routine x 2)     Status: None   Collection Time: 02/25/20 12:38 PM   Specimen: BLOOD  Result Value Ref Range Status   Specimen Description BLOOD SITE NOT SPECIFIED  Final   Special Requests   Final    BOTTLES DRAWN AEROBIC AND ANAEROBIC Blood Culture results may not be optimal due to an inadequate volume of blood received in culture bottles Performed at [facility], [address] [place], [place] [address]    Culture NO  GROWTH 5 DAYS  Final   Report Status 03/01/2020 FINAL  Final  Blood Culture (routine x 2)     Status: None   Collection Time: 02/25/20 12:39 PM   Specimen: BLOOD LEFT HAND  Result Value Ref Range Status   Specimen Description BLOOD LEFT HAND  Final   Special Requests   Final    BOTTLES DRAWN AEROBIC AND ANAEROBIC Blood Culture adequate volume Performed at [facility], [address] [place], [place] [address]    Culture NO GROWTH 5 DAYS  Final   Report Status 03/01/2020 FINAL  Final         Radiology Studies: DG CHEST PORT 1 VIEW  Result Date: 03/04/2020 CLINICAL DATA:  COVID-19 positive, dyspnea EXAM: PORTABLE CHEST 1 VIEW COMPARISON:  03/03/2020 chest radiograph. FINDINGS: Stable cardiomediastinal silhouette with normal heart size. Small lateral basilar right pneumothorax is unchanged. No left pneumothorax. No mediastinal shift. No pleural effusion. No pulmonary edema. Minimal hazy bibasilar lung opacities are unchanged. IMPRESSION: 1. Stable small lateral basilar right pneumothorax. No mediastinal shift. 2. Stable minimal hazy bibasilar lung opacities. Electronically Signed   By: Delbert Phenix M.D.   On: 03/04/2020 12:00        Scheduled Meds: . vitamin C  500 mg Oral Daily  . calcium carbonate  1 tablet  Oral Q breakfast  . dexamethasone  6 mg Oral Daily  . enoxaparin (LOVENOX) injection  40 mg Subcutaneous Q24H  . famotidine  10 mg Oral Daily  . fluticasone  1 spray Each Nare Daily  . insulin aspart  0-9 Units Subcutaneous TID WC  . Ipratropium-Albuterol  1 puff Inhalation Q6H  . multivitamin with minerals  1 tablet Oral Daily  . trimethoprim  100 mg Oral Daily  . zinc sulfate  220 mg Oral Daily   Continuous Infusions:   Time spent: 25 minutes with over 50% of the time coordinating the patient's care    Jae Dire, DO [facility] Pager [phone]  Call night coverage person covering after 7pm

## 2020-03-05 NOTE — TOC Initial Note (Signed)
Transition of Care [facility]) - Initial/Assessment Note    Patient Details  Name: Bonnie Alexander MRN: [id number] Date of Birth: January 18, 1951  [facility] CM/SW Contact:    Armanda Heritage, RN Phone Number: 03/05/2020, 3:57 PM  Clinical Narrative:   CM spoke with patient regarding PT recommendations.  Patient is agreeable to HHPT services.  [facility] with [facility] for HHPT.  MD please place [facility] order prior to dc. Patient declined rolling walker.                Expected Discharge Plan: Home w Home Health Services Barriers to Discharge: Continued Medical Work up   Patient Goals and CMS Choice Patient states their goals for this hospitalization and ongoing recovery are:: to go home CMS Medicare.gov Compare Post Acute Care list provided to:: Patient Choice offered to / list presented to : Patient  Expected Discharge Plan and Services Expected Discharge Plan: Home w Home Health Services   Discharge Planning Services: CM Consult Post Acute Care Choice: Home Health Living arrangements for the past 2 months: Single Family Home                 DME Arranged: N/A DME Agency: NA       HH Arranged: PT HH Agency: [facility] Date HH Agency Contacted: 03/05/20 Time HH Agency Contacted: [phone] Representative spoke with at [facility] Agency: Barbara Cower  Prior Living Arrangements/Services Living arrangements for the past 2 months: Single Family Home Lives with:: Self Patient language and need for interpreter reviewed:: Yes Do you feel safe going back to the place where you live?: Yes      Need for Family Participation in Patient Care: No (Comment) Care giver support system in place?: No (comment)   Criminal Activity/Legal Involvement Pertinent to Current Situation/Hospitalization: No - Comment as needed  Activities of Daily Living Home Assistive Devices/Equipment: CBG Meter, Eyeglasses ADL Screening (condition at time of admission) Patient's cognitive  ability adequate to safely complete daily activities?: Yes Is the patient deaf or have difficulty hearing?: No Does the patient have difficulty seeing, even when wearing glasses/contacts?: No Does the patient have difficulty concentrating, remembering, or making decisions?: No Patient able to express need for assistance with ADLs?: Yes Does the patient have difficulty dressing or bathing?: No Independently performs ADLs?: Yes (appropriate for developmental age) Does the patient have difficulty walking or climbing stairs?: Yes(secondary to shortness of breath) Weakness of Legs: Both Weakness of Arms/Hands: None  Permission Sought/Granted                  Emotional Assessment   Attitude/Demeanor/Rapport: Engaged Affect (typically observed): Accepting Orientation: : Oriented to Self, Oriented to Place, Oriented to  Time, Oriented to Situation   Psych Involvement: No (comment)  Admission diagnosis:  Hypoxia [R09.02] Acute hypoxemic respiratory failure (HCC) [J96.01] COVID-19 virus infection [U07.1] Patient Active Problem List   Diagnosis Date Noted  . COVID-19 virus infection   . Hypoxia   . Acute hypoxemic respiratory failure (HCC) 02/25/2020  . Depression determined by examination 07/20/2015  . 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 Left knee injury 09/06/2014   PCP:  Daisy Floro, MD Pharmacy:   [id number] - [place], [place] - [address]OSS [address] [place] [address] Phone: [phone] Fax: [phone]     Social Determinants of Health (SDOH) Interventions    Readmission Risk Interventions No flowsheet data found.

## 2020-03-06 ENCOUNTER — Inpatient Hospital Stay (HOSPITAL_COMMUNITY): Payer: Medicare Other

## 2020-03-06 DIAGNOSIS — J9311 Primary spontaneous pneumothorax: Secondary | ICD-10-CM | POA: Diagnosis not present

## 2020-03-06 DIAGNOSIS — U071 COVID-19: Secondary | ICD-10-CM | POA: Diagnosis not present

## 2020-03-06 DIAGNOSIS — J9601 Acute respiratory failure with hypoxia: Secondary | ICD-10-CM | POA: Diagnosis not present

## 2020-03-06 LAB — GLUCOSE, CAPILLARY
Glucose-Capillary: 93 mg/dL (ref 70–99)
Glucose-Capillary: 122 mg/dL — ABNORMAL HIGH (ref 70–99)
Glucose-Capillary: 146 mg/dL — ABNORMAL HIGH (ref 70–99)
Glucose-Capillary: 134 mg/dL — ABNORMAL HIGH (ref 70–99)

## 2020-03-06 NOTE — Care Management Important Message (Signed)
Important Message  Patient Details IM Letter given to Ezekiel Ina RN Case Manager to present to the Patient Name: NATALIYAH PACKHAM MRN: [id number] Date of Birth: [birth year]   Medicare Important Message Given:  Yes     Caren Macadam 03/06/2020, 10:15 AM

## 2020-03-06 NOTE — Progress Notes (Signed)
PROGRESS NOTE    Bonnie Alexander    Code Status: Full Code  EXH:[id number] DOB: Feb 22, 1951 DOA: 02/25/2020 LOS: 10 days  PCP: Daisy Floro, MD CC:  Chief Complaint  Patient presents with  . COVID Positive  . Low Oxygen Saturation       Hospital Summary   This is a 69 year old female with history of OSA, anxiety, fibromyalgia, GERD, hyperlipidemia who presented with about 10 days of respiratory symptoms and confirmed diagnosis of COVID-19.  She was going to have outpatient infusion for Covid but was noted to be hypoxemic and was sent to the ED for admission.  Has since been requiring 4 L O2, room air at baseline  4/15: overnight with increased O2 requirements. CXR ordered in AM: small loculated right pneumothorax. PCCM notified who stated she is not a candidate for chest tube at this time since it is small but may need a chest tube if it gets larger.   A & P   Active Problems:   Acute hypoxemic respiratory failure (HCC)   COVID-19 virus infection   Hypoxia   1. Acute hypoxic respiratory failure secondary to COVID-19 pneumonia as well as new small spontaneous right loculated pneumothorax a. Still requiring supplemental O2 at rest  b. S/p 5 days remdesivir and 10 days dexamethasone, s/p Actemra c. Continue Combivent, flutter valve and incentive spirometry, encourage pronation as tolerated d. Continue vitamins and zinc e. CXR with persistent pneumothorax f. Discussed with PCCM, will change to NRB and follow up CXR in AM  2. GERD Maalox as needed and Pepcid daily  3. Asymptomatic NSVT resolved  4. Hyperlipidemia Not on any medications  5. History of fibromyalgia not on any medications  6. Polycythemia, likely secondary to hemoconcentration a. Resolved with IV fluids  DVT prophylaxis: Lovenox Family Communication: Husband updated today Disposition Plan:  Status is: Inpatient.  Continues to remain inpatient due to increasing O2 requirements today and persistent  pneumothorax  Remains inpatient appropriate because:Inpatient level of care appropriate due to severity of illness   Dispo: The patient is from: Home              Anticipated d/c is to: Home              Anticipated d/c date is: 2 days              Patient currently is not medically stable to d/c.    Pressure injury documentation    None  Consultants  Discussed CXR findings with PCCM   Procedures  None  Antibiotics   Anti-infectives (From admission, onward)   Start     Dose/Rate Route Frequency Ordered Stop   02/26/20 1000  remdesivir 100 mg in sodium chloride 0.9 % 100 mL IVPB  Status:  Discontinued     100 mg 200 mL/hr over 30 Minutes Intravenous Daily 02/25/20 2009 02/25/20 2022   02/26/20 1000  remdesivir 100 mg in sodium chloride 0.9 % 100 mL IVPB     100 mg 200 mL/hr over 30 Minutes Intravenous Daily 02/25/20 1837 02/29/20 0935   02/25/20 2145  remdesivir 100 mg in sodium chloride 0.9 % 100 mL IVPB     100 mg 200 mL/hr over 30 Minutes Intravenous Every hour 02/25/20 2053 02/26/20 0000   02/25/20 2100  trimethoprim (TRIMPEX) tablet 100 mg     100 mg Oral Daily 02/25/20 2009     02/25/20 2008  remdesivir 200 mg in sodium chloride 0.9% 250 mL IVPB  Status:  Discontinued     200 mg 580 mL/hr over 30 Minutes Intravenous Once 02/25/20 2009 02/25/20 2022   02/25/20 2000  remdesivir 100 mg in sodium chloride 0.9 % 100 mL IVPB  Status:  Discontinued     100 mg 200 mL/hr over 30 Minutes Intravenous Every hour 02/25/20 1837 02/25/20 2057        Subjective   Without complaints and states she is feeling well. Disappointed she is not going home today but she understands. No complaints or overnight events.   Objective   Vitals:   03/06/20 0600 03/06/20 0601 03/06/20 0700 03/06/20 0704  BP:      Pulse: (!) 58 71 (!) 52 (!) 54  Resp: 14 19 16 16   Temp:      TempSrc:      SpO2: 95% 96% 96% 96%  Weight:      Height:        Intake/Output Summary (Last 24 hours) at  03/06/2020 1402 Last data filed at 03/06/2020 0300 Gross per 24 hour  Intake 420 ml  Output --  Net 420 ml   Filed Weights   03/04/20 0500 03/05/20 0309 03/06/20 0439  Weight: 68.3 kg 66.9 kg 67.4 kg    Examination:  Physical Exam Vitals and nursing note reviewed.  Constitutional:      Appearance: Normal appearance.  HENT:     Head: Normocephalic and atraumatic.  Cardiovascular:     Rate and Rhythm: Normal rate and regular rhythm.  Pulmonary:     Effort: Pulmonary effort is normal. No respiratory distress.  Abdominal:     General: Abdomen is flat.  Musculoskeletal:        General: No swelling or tenderness.  Neurological:     Mental Status: She is alert.  Psychiatric:        Mood and Affect: Mood normal.        Behavior: Behavior normal.     Data Reviewed: I have personally reviewed following labs and imaging studies  CBC: Recent Labs  Lab 02/29/20 0441 03/01/20 0503 03/02/20 0432 03/03/20 0443 03/05/20 0453  WBC 9.4 14.7* 11.1* 11.5* 10.8*  NEUTROABS 7.1 10.5*  --   --   --   HGB 13.8 15.1* 13.4 13.8 14.3  HCT 43.3 48.3* 41.5 42.5 45.2  MCV 87.5 89.1 87.6 87.3 87.6  PLT 327 454* 289 282 627   Basic Metabolic Panel: Recent Labs  Lab 02/29/20 0441 03/01/20 0503 03/02/20 0432 03/03/20 0443 03/05/20 0453  NA 141 136 138 136 136  K 4.5 4.1 4.5 4.7 4.4  CL 109 105 105 104 100  CO2 20* 20* 23 24 26   GLUCOSE 179* 135* 140* 134* 102*  BUN 21 21 21 20  26*  CREATININE 0.72 0.78 0.76 0.64 0.70  CALCIUM 8.8* 8.6* 8.3* 8.5* 8.6*  MG 2.3 2.4 2.3  --   --   PHOS 3.3 3.6  --   --   --    GFR: Estimated Creatinine Clearance: 60.7 mL/min (by C-G formula based on SCr of 0.7 mg/dL). Liver Function Tests: Recent Labs  Lab 02/29/20 0441 03/01/20 0503  AST 39 44*  ALT 34 43  ALKPHOS 47 56  BILITOT 0.6 0.8  PROT 6.0* 6.6  ALBUMIN 2.9* 3.2*   No results for input(s): LIPASE, AMYLASE in the last 168 hours. No results for input(s): AMMONIA in the last 168  hours. Coagulation Profile: No results for input(s): INR, PROTIME in the last 168 hours. Cardiac Enzymes: No  results for input(s): CKTOTAL, CKMB, CKMBINDEX, TROPONINI in the last 168 hours. BNP (last 3 results) No results for input(s): PROBNP in the last 8760 hours. HbA1C: No results for input(s): HGBA1C in the last 72 hours. CBG: Recent Labs  Lab 03/05/20 1123 03/05/20 1637 03/05/20 2153 03/06/20 0809 03/06/20 1145  GLUCAP 102* 142* 126* 93 122*   Lipid Profile: No results for input(s): CHOL, HDL, LDLCALC, TRIG, CHOLHDL, LDLDIRECT in the last 72 hours. Thyroid Function Tests: No results for input(s): TSH, T4TOTAL, FREET4, T3FREE, THYROIDAB in the last 72 hours. Anemia Panel: No results for input(s): VITAMINB12, FOLATE, FERRITIN, TIBC, IRON, RETICCTPCT in the last 72 hours. Sepsis Labs: No results for input(s): PROCALCITON, LATICACIDVEN in the last 168 hours.  No results found for this or any previous visit (from the past 240 hour(s)).       Radiology Studies: DG CHEST PORT 1 VIEW  Result Date: 03/06/2020 CLINICAL DATA:  69 year old female positive COVID-19. Right side pneumothorax. EXAM: PORTABLE CHEST 1 VIEW COMPARISON:  Portable chest 03/04/2020 and earlier. FINDINGS: Mediastinal contours remain normal. Stable lung volumes. Persistent small right lateral pneumothorax, stable since 03/03/2020. Mild blunting of the left costophrenic angle today. But no right pleural effusion is evident. Scattered mild bilateral pulmonary interstitial markings are stable. Negative visible bowel gas and osseous structures, prior surgery to the proximal right humerus. IMPRESSION: 1. Stable small right lateral pneumothorax since 03/03/2020. 2. Evidence of a small left pleural effusion today, but no other new cardiopulmonary abnormality. Electronically Signed   By: Odessa Fleming M.D.   On: 03/06/2020 08:00        Scheduled Meds: . vitamin C  500 mg Oral Daily  . calcium carbonate  1 tablet Oral Q  breakfast  . dexamethasone  6 mg Oral Daily  . enoxaparin (LOVENOX) injection  40 mg Subcutaneous Q24H  . famotidine  10 mg Oral Daily  . fluticasone  1 spray Each Nare Daily  . insulin aspart  0-9 Units Subcutaneous TID WC  . Ipratropium-Albuterol  1 puff Inhalation Q6H  . multivitamin with minerals  1 tablet Oral Daily  . trimethoprim  100 mg Oral Daily  . zinc sulfate  220 mg Oral Daily   Continuous Infusions:   Time spent: 26 minutes with over 50% of the time coordinating the patient's care    Jae Dire, DO [facility] Pager [phone]  Call night coverage person covering after 7pm

## 2020-03-06 NOTE — Plan of Care (Signed)
Discussed with patient plan of care for the evening, pain management and use of non re-breather mask with some teach back displayed.  Patient update family themselves.  Patient wants to get back home to assist her husband.

## 2020-03-06 NOTE — Progress Notes (Signed)
Physical Therapy Treatment Patient Details Name: Bonnie Alexander MRN: [id number] DOB: Aug 14, 1951 Today's Date: 03/06/2020    History of Present Illness 69 yo female admitted with COVID Pna. Hx of fibromyalgia, vertigo. small pneumothorax, not candidate for CT at this time per CCM    PT Comments    The patient received on NRB/15 L  with SPO2 100%. Patient ambulated x 20' x 2 with SPO2 >95% on NRB. Chest CT 4/19-Stable small right lateral pneumothorax since 03/03/2020,  Evidence of a small left pleural effusion today. Dyspnea 2-3/4. Continue progressive activity.   Follow Up Recommendations  Home health PT     Equipment Recommendations  Rolling walker with 5" wheels    Recommendations for Other Services       Precautions / Restrictions Precautions Precaution Comments: monitor sats    Mobility  Bed Mobility   Bed Mobility: Sit to Supine       Sit to supine: Supervision   General bed mobility comments: for safety and line management.  Transfers Overall transfer level: Needs assistance Equipment used: None Transfers: Sit to/from Stand Sit to Stand: Min guard;Supervision         General transfer comment: for safety  Ambulation/Gait Ambulation/Gait assistance: Min guard;Supervision Gait Distance (Feet): 20 Feet(x 2) Assistive device: (cruising on bed and walls) Gait Pattern/deviations: Step-through pattern;Decreased stride length     General Gait Details: slow and steady. SPO2 on NRB 100%. HR 89-110  RR 15-25   Stairs             Wheelchair Mobility    Modified Rankin (Stroke Patients Only)       Balance                                            Cognition   Behavior During Therapy: WFL for tasks assessed/performed                                          Exercises General Exercises - Lower Extremity Hip Flexion/Marching: AROM;Both;10 reps;Standing    General Comments        Pertinent Vitals/Pain  Pain Assessment: No/denies pain    Home Living                      Prior Function            PT Goals (current goals can now be found in the care plan section) Progress towards PT goals: Progressing toward goals    Frequency    Min 3X/week      PT Plan Current plan remains appropriate    Co-evaluation              AM-PAC PT "6 Clicks" Mobility   Outcome Measure  Help needed turning from your back to your side while in a flat bed without using bedrails?: None Help needed moving from lying on your back to sitting on the side of a flat bed without using bedrails?: None Help needed moving to and from a bed to a chair (including a wheelchair)?: A Little Help needed standing up from a chair using your arms (e.g., wheelchair or bedside chair)?: A Little Help needed to walk in hospital room?: A Little Help needed climbing 3-5 steps with a railing? :  A Lot 6 Click Score: 19    End of Session Equipment Utilized During Treatment: Oxygen Activity Tolerance: Patient tolerated treatment well Patient left: in bed;with call bell/phone within reach Nurse Communication: Mobility status PT Visit Diagnosis: Unsteadiness on feet (R26.81);Muscle weakness (generalized) (M62.81);Difficulty in walking, not elsewhere classified (R26.2)     Time: 7622-6333 PT Time Calculation (min) (ACUTE ONLY): 23 min  Charges:  $Gait Training: 8-22 mins $Therapeutic Exercise: 8-22 mins                     Tresa Endo PT Acute Rehabilitation Services Pager [phone] Office [phone]    Claretha Cooper 03/06/2020, 4:48 PM

## 2020-03-07 ENCOUNTER — Inpatient Hospital Stay (HOSPITAL_COMMUNITY): Payer: Medicare Other

## 2020-03-07 ENCOUNTER — Telehealth: Payer: Self-pay | Admitting: Family Medicine

## 2020-03-07 DIAGNOSIS — J9601 Acute respiratory failure with hypoxia: Secondary | ICD-10-CM | POA: Diagnosis not present

## 2020-03-07 DIAGNOSIS — J9311 Primary spontaneous pneumothorax: Secondary | ICD-10-CM | POA: Diagnosis not present

## 2020-03-07 LAB — BASIC METABOLIC PANEL
Anion gap: 11 (ref 5–15)
BUN: 29 mg/dL — ABNORMAL HIGH (ref 8–23)
CO2: 25 mmol/L (ref 22–32)
Calcium: 8.6 mg/dL — ABNORMAL LOW (ref 8.9–10.3)
Chloride: 100 mmol/L (ref 98–111)
Creatinine, Ser: 0.65 mg/dL (ref 0.44–1.00)
GFR calc Af Amer: >60 mL/min (ref >60)
GFR calc non Af Amer: >60 mL/min (ref >60)
Glucose, Bld: 112 mg/dL — ABNORMAL HIGH (ref 70–99)
Potassium: 4.8 mmol/L (ref 3.5–5.1)
Sodium: 136 mmol/L (ref 135–145)

## 2020-03-07 LAB — CBC
HCT: 44.6 % (ref 36.0–46.0)
Hemoglobin: 14.1 g/dL (ref 12.0–15.0)
MCH: 28.4 pg (ref 26.0–34.0)
MCHC: 31.6 g/dL (ref 30.0–36.0)
MCV: 89.7 fL (ref 80.0–100.0)
Platelets: 252 10*3/uL (ref 150–400)
RBC: 4.97 MIL/uL (ref 3.87–5.11)
RDW: 13.1 % (ref 11.5–15.5)
WBC: 12.2 10*3/uL — ABNORMAL HIGH (ref 4.0–10.5)
nRBC: 0 % (ref 0.0–0.2)

## 2020-03-07 LAB — GLUCOSE, CAPILLARY
Glucose-Capillary: 82 mg/dL (ref 70–99)
Glucose-Capillary: 98 mg/dL (ref 70–99)
Glucose-Capillary: 145 mg/dL — ABNORMAL HIGH (ref 70–99)

## 2020-03-07 MED ORDER — GUAIFENESIN-DM 100-10 MG/5ML PO SYRP: 10.0000 mL | ORAL_SOLUTION | ORAL | 0 refills | Status: DC | PRN

## 2020-03-07 MED ORDER — IPRATROPIUM-ALBUTEROL 20-100 MCG/ACT IN AERS: 1.0000 | INHALATION_SPRAY | Freq: Four times a day (QID) | RESPIRATORY_TRACT | 0 refills | Status: DC | PRN

## 2020-03-07 NOTE — Progress Notes (Signed)
SATURATION QUALIFICATIONS: (This note is used to comply with regulatory documentation for home oxygen)  Patient Saturations on Room Air at Rest = 93%  Patient Saturations on Room Air while Ambulating = 90-95%  Patient Saturations on 0 Liters of oxygen while Ambulating = 90-95%  Please briefly explain why patient needs home oxygen:

## 2020-03-07 NOTE — Progress Notes (Signed)
Physical Therapy Treatment Patient Details Name: Bonnie Alexander MRN: [id number] DOB: Apr 02, 1951 Today's Date: 03/07/2020    History of Present Illness 69 yo female admitted with COVID Pna. Hx of fibromyalgia, vertigo. small pneumothorax, not candidate for CT at this time per CCM    PT Comments    The  Patient is pleased with  Dc plan today. Patient has weaned from O2, mobilized in room on RA at 91% -955 resting. Reviewed HEP, breathing apparatus and progressive mobility.   Follow Up Recommendations  Home health PT     Equipment Recommendations  None recommended by PT    Recommendations for Other Services       Precautions / Restrictions Precautions Precaution Comments: monitor sats    Mobility  Bed Mobility Overal bed mobility: Independent                Transfers Overall transfer level: Independent                  Ambulation/Gait Ambulation/Gait assistance: Supervision Gait Distance (Feet): 40 Feet Assistive device: None(objects)   Gait velocity: decr   General Gait Details: slow and steady. SPO2 RA 91%   Stairs             Wheelchair Mobility    Modified Rankin (Stroke Patients Only)       Balance                                            Cognition Arousal/Alertness: Awake/alert                                            Exercises General Exercises - Lower Extremity Hip Flexion/Marching: AROM;Both;10 reps;Standing Toe Raises: AROM;Both;15 reps;Seated Heel Raises: AROM;10 reps;Both;Standing Mini-Sqauts: AROM;10 reps;Both;Standing    General Comments        Pertinent Vitals/Pain Pain Assessment: No/denies pain    Home Living                      Prior Function            PT Goals (current goals can now be found in the care plan section) Progress towards PT goals: Progressing toward goals    Frequency    Min 3X/week      PT Plan Current plan remains  appropriate    Co-evaluation              AM-PAC PT "6 Clicks" Mobility   Outcome Measure  Help needed turning from your back to your side while in a flat bed without using bedrails?: None Help needed moving from lying on your back to sitting on the side of a flat bed without using bedrails?: None Help needed moving to and from a bed to a chair (including a wheelchair)?: None Help needed standing up from a chair using your arms (e.g., wheelchair or bedside chair)?: None Help needed to walk in hospital room?: A Little Help needed climbing 3-5 steps with a railing? : A Little 6 Click Score: 22    End of Session   Activity Tolerance: Patient tolerated treatment well Patient left: in bed;with call bell/phone within reach Nurse Communication: Mobility status PT Visit Diagnosis: Unsteadiness on feet (R26.81);Muscle weakness (generalized) (M62.81);Difficulty in walking,  not elsewhere classified (R26.2)     Time: 2883-3744 PT Time Calculation (min) (ACUTE ONLY): 27 min  Charges:  $Gait Training: 8-22 mins $Therapeutic Exercise: 8-22 mins                     Blanchard Kelch PT Acute Rehabilitation Services Pager [phone] Office [phone]    Rada Hay 03/07/2020, 3:33 PM

## 2020-03-07 NOTE — TOC Progression Note (Signed)
Transition of Care [facility]) - Progression Note    Patient Details  Name: Bonnie Alexander MRN: [id number] Date of Birth: [birth year]  Transition of Care [facility]) CM/SW Contact  Geni Bers, RN Phone Number: 03/07/2020, 11:38 AM  Clinical Narrative:    Spoke with pt who is very pleasant concerning discharge needs. Pt is setup with [facility] for HH. Pt continues to have a Pneumothorax and is not ready for discharge.    Expected Discharge Plan: Home w Home Health Services Barriers to Discharge: Continued Medical Work up  Expected Discharge Plan and Services Expected Discharge Plan: Home w Home Health Services   Discharge Planning Services: CM Consult Post Acute Care Choice: Home Health Living arrangements for the past 2 months: Single Family Home                 DME Arranged: N/A DME Agency: NA       HH Arranged: PT HH Agency: [facility] Date HH Agency Contacted: 03/05/20 Time HH Agency Contacted: [phone] Representative spoke with at [facility] Agency: Barbara Cower   Social Determinants of Health (SDOH) Interventions    Readmission Risk Interventions No flowsheet data found.

## 2020-03-07 NOTE — Discharge Summary (Signed)
Physician Discharge Summary  Bonnie Alexander NTZ:[id number] DOB: [birth year]   PCP: Lawerance Cruel, MD  Admit date: 02/25/2020 Discharge date: 03/07/2020 Length of Stay: 11 days   Code Status: Full Code  Admitted From:  Home Discharged to:   [place]: PT  Equipment/Devices:  None Discharge Condition:  Stable  Recommendations for Outpatient Follow-up   1. Follow up with pulmonology in 1 week for follow-up of pneumothorax  Hospital Summary  This is a 69 year old female with history of OSA, anxiety, fibromyalgia, GERD, hyperlipidemia who presented with about 10 days of respiratory symptoms and confirmed diagnosis of COVID-19.  She was going to have outpatient infusion for Covid but was noted to be hypoxemic and was sent to the ED for admission.  Had since been requiring 4 L O2, room air at baseline.  4/15: overnight with increased O2 requirements. CXR ordered in AM: small loculated right pneumothorax. PCCM notified who stated she is not a candidate for chest tube at this time since it is small but may need a chest tube if it gets larger. .  Patient continued to have serial chest x-rays over the coming days without any change in pneumothorax.  She had improving O2 requirements throughout this time.  4/20: CXR with persistent right pneumothorax.  Discussed with PCCM who recommended outpatient follow-up with repeat chest x-ray in 1 week.  Patient was able to tolerate room air at rest and on ambulation.  Discharged in stable condition  A & P   Active Problems:   Acute hypoxemic respiratory failure ([place])   COVID-19 virus infection   Hypoxia    1. Acute hypoxic respiratory failure secondary to COVID-19 pneumonia as well as new small spontaneous right loculated pneumothorax a. Hypoxia resolved, tolerating room air at rest and on ambulation b. S/p 5 days remdesivir and 10 days dexamethasone, s/p Actemra c. Discharged with Combivent and Robitussin-DM d. Plan is to follow-up with  PCCM in 1 week with follow-up CXR for stable persistent pneumothorax  2. GERD Continue outpatient regimen  3. Asymptomatic NSVT resolved  4. Hyperlipidemia Not on any medications  5. History of fibromyalgia not on any medications  6. Polycythemia, likely secondary to hemoconcentration Resolved with IV fluids  Consultants  . Discussed with PCCM not formally consulted  Procedures  . None  Antibiotics   Anti-infectives (From admission, onward)   Start     Dose/Rate Route Frequency Ordered Stop   02/26/20 1000  remdesivir 100 mg in sodium chloride 0.9 % 100 mL IVPB  Status:  Discontinued     100 mg 200 mL/hr over 30 Minutes Intravenous Daily 02/25/20 2009 02/25/20 2022   02/26/20 1000  remdesivir 100 mg in sodium chloride 0.9 % 100 mL IVPB     100 mg 200 mL/hr over 30 Minutes Intravenous Daily 02/25/20 1837 02/29/20 0935   02/25/20 2145  remdesivir 100 mg in sodium chloride 0.9 % 100 mL IVPB     100 mg 200 mL/hr over 30 Minutes Intravenous Every hour 02/25/20 2053 02/26/20 0000   02/25/20 2100  trimethoprim (TRIMPEX) tablet 100 mg     100 mg Oral Daily 02/25/20 2009     02/25/20 2008  remdesivir 200 mg in sodium chloride 0.9% 250 mL IVPB  Status:  Discontinued     200 mg 580 mL/hr over 30 Minutes Intravenous Once 02/25/20 2009 02/25/20 2022   02/25/20 2000  remdesivir 100 mg in sodium chloride 0.9 % 100 mL IVPB  Status:  Discontinued  100 mg 200 mL/hr over 30 Minutes Intravenous Every hour 02/25/20 1837 02/25/20 2057       Subjective  Patient seen and examined at bedside no acute distress and resting comfortably.  No events overnight.  Tolerating diet. In good spirits and anticipating discharge.   Denies any chest pain, shortness of breath, fever, nausea, vomiting, urinary or bowel complaints. Otherwise ROS negative    Objective   Discharge Exam: Vitals:   03/07/20 0610 03/07/20 1520  BP:  125/80  Pulse: 61 (!) 105  Resp: 13 18  Temp:  97.7 F (36.5 C)   SpO2: 99% (!) 88%   Vitals:   03/07/20 0500 03/07/20 0600 03/07/20 0610 03/07/20 1520  BP:    125/80  Pulse: 60 (!) 48 61 (!) 105  Resp: (!) '23 12 13 18  ' Temp:    97.7 F (36.5 C)  TempSrc:    Oral  SpO2: 99% 99% 99% (!) 88%  Weight:      Height:        Physical Exam Vitals and nursing note reviewed.  Constitutional:      Appearance: Normal appearance.  HENT:     Head: Normocephalic and atraumatic.  Eyes:     Conjunctiva/sclera: Conjunctivae normal.  Cardiovascular:     Rate and Rhythm: Normal rate and regular rhythm.  Pulmonary:     Effort: Pulmonary effort is normal. No respiratory distress.  Abdominal:     General: Abdomen is flat.     Palpations: Abdomen is soft.  Musculoskeletal:        General: No swelling or tenderness.  Skin:    Coloration: Skin is not jaundiced or pale.  Neurological:     Mental Status: She is alert. Mental status is at baseline.  Psychiatric:        Mood and Affect: Mood normal.        Behavior: Behavior normal.       The results of significant diagnostics from this hospitalization (including imaging, microbiology, ancillary and laboratory) are listed below for reference.     Microbiology: No results found for this or any previous visit (from the past 240 hour(s)).   Labs: BNP (last 3 results) No results for input(s): BNP in the last 8760 hours. Basic Metabolic Panel: Recent Labs  Lab 03/01/20 0503 03/02/20 0432 03/03/20 0443 03/05/20 0453 03/07/20 0424  NA 136 138 136 136 136  K 4.1 4.5 4.7 4.4 4.8  CL 105 105 104 100 100  CO2 20* '23 24 26 25  ' GLUCOSE 135* 140* 134* 102* 112*  BUN '21 21 20 ' 26* 29*  CREATININE 0.78 0.76 0.64 0.70 0.65  CALCIUM 8.6* 8.3* 8.5* 8.6* 8.6*  MG 2.4 2.3  --   --   --   PHOS 3.6  --   --   --   --    Liver Function Tests: Recent Labs  Lab 03/01/20 0503  AST 44*  ALT 43  ALKPHOS 56  BILITOT 0.8  PROT 6.6  ALBUMIN 3.2*   No results for input(s): LIPASE, AMYLASE in the last 168  hours. No results for input(s): AMMONIA in the last 168 hours. CBC: Recent Labs  Lab 03/01/20 0503 03/02/20 0432 03/03/20 0443 03/05/20 0453 03/07/20 0424  WBC 14.7* 11.1* 11.5* 10.8* 12.2*  NEUTROABS 10.5*  --   --   --   --   HGB 15.1* 13.4 13.8 14.3 14.1  HCT 48.3* 41.5 42.5 45.2 44.6  MCV 89.1 87.6 87.3 87.6 89.7  PLT 454* 289 282 316 252   Cardiac Enzymes: No results for input(s): CKTOTAL, CKMB, CKMBINDEX, TROPONINI in the last 168 hours. BNP: Invalid input(s): POCBNP CBG: Recent Labs  Lab 03/06/20 1145 03/06/20 1640 03/06/20 2142 03/07/20 0742 03/07/20 1121  GLUCAP 122* 146* 134* 82 98   D-Dimer No results for input(s): DDIMER in the last 72 hours. Hgb A1c No results for input(s): HGBA1C in the last 72 hours. Lipid Profile No results for input(s): CHOL, HDL, LDLCALC, TRIG, CHOLHDL, LDLDIRECT in the last 72 hours. Thyroid function studies No results for input(s): TSH, T4TOTAL, T3FREE, THYROIDAB in the last 72 hours.  Invalid input(s): FREET3 Anemia work up No results for input(s): VITAMINB12, FOLATE, FERRITIN, TIBC, IRON, RETICCTPCT in the last 72 hours. Urinalysis No results found for: COLORURINE, APPEARANCEUR, LABSPEC, [place], GLUCOSEU, HGBUR, BILIRUBINUR, KETONESUR, PROTEINUR, UROBILINOGEN, NITRITE, LEUKOCYTESUR Sepsis Labs Invalid input(s): PROCALCITONIN,  WBC,  LACTICIDVEN Microbiology No results found for this or any previous visit (from the past 240 hour(s)).  Discharge Instructions     Discharge Instructions    Ambulatory referral to Pulmonology   Complete by: As directed    Spontaneous pneumothorax in setting of COVID. Follow up   Reason for referral: Other   Diet - low sodium heart healthy   Complete by: As directed    Discharge instructions   Complete by: As directed    You were seen in the hospital for COVID-19.  Upon discharge: -You will need to get a repeat chest x-ray in 1 week and set up an appointment with the pulmonologist for  follow-up -Continue taking your home medications as prescribed -You can take Robitussin-DM every 4 hours as needed for cough -You can use Combivent inhaler every 6 hours as needed for wheeze or shortness of breath  -You are still contagious with COVID-19 and should self isolate at home.  Isolation can be discontinued when the following criteria are met:  At least 10 days have passed since symptoms first appeared AND You are at least 1 day (24 hours) since resolution of fever without the use of any fever reducing medications (Tylenol, Motrin, ibuprofen, etc.) AND There is improvement in your symptoms (ex. shortness of breath, cough, etc.)  If you have any questions do not hesitate to contact your primary care physician or return to the ED if worsening symptoms.   Increase activity slowly   Complete by: As directed      Allergies as of 03/07/2020      Reactions   Celexa [citalopram Hydrobromide]    Claritin [loratadine]    Omeprazole    Prednisone    Chest pain   Sulfa Antibiotics       Medication List    TAKE these medications   budesonide 32 MCG/ACT nasal spray Commonly known as: RHINOCORT AQUA Place 1 spray into both nostrils daily.   calcium carbonate 1250 (500 Ca) MG chewable tablet Commonly known as: OS-CAL Chew 1 tablet by mouth daily. gummy   estradiol 0.1 MG/GM vaginal cream Commonly known as: ESTRACE Place 1 Applicatorful vaginally 2 (two) times a week.   guaiFENesin-dextromethorphan 100-10 MG/5ML syrup Commonly known as: ROBITUSSIN DM Take 10 mLs by mouth every 4 (four) hours as needed for cough.   Ipratropium-Albuterol 20-100 MCG/ACT Aers respimat Commonly known as: COMBIVENT Inhale 1 puff into the lungs every 6 (six) hours as needed for wheezing or shortness of breath.   multivitamin tablet Take 1 tablet by mouth daily. Gummy   sertraline 25 MG tablet Commonly known  as: Zoloft Take 1 tablet (25 mg total) by mouth daily.   trimethoprim 100 MG  tablet Commonly known as: TRIMPEX Take 100 mg by mouth daily.   vitamin C 250 MG tablet Commonly known as: ASCORBIC ACID Take 250 mg by mouth daily. gummy            Durable Medical Equipment  (From admission, onward)         Start     Ordered   03/07/20 1423  DME Walker  Once    Question Answer Comment  Walker: With 5 Inch Wheels   Patient needs a walker to treat with the following condition Ambulatory dysfunction      03/07/20 1443         Follow-up [place], [place] Follow up.   Why: Call for [place] information: [place] [address] [phone]          Allergies  Allergen Reactions  . Celexa [Citalopram Hydrobromide]   . Claritin [Loratadine]   . Omeprazole   . Prednisone     Chest pain   . Sulfa Antibiotics    Status is: Inpatient    Dispo: The patient is from: Home              Anticipated d/c is to: Home              Anticipated d/c date is: today              Patient currently is medically stable to d/c.        Time coordinating discharge: Over 30 minutes   SIGNED:   Harold Hedge, D.O. [facility] Pager: [phone]  03/07/2020, 3:22 PM

## 2020-03-07 NOTE — Telephone Encounter (Signed)
   Bonnie Alexander DOB: [birth year] MRN: [id number]   RIDER WAIVER AND RELEASE OF LIABILITY  For purposes of improving physical access to our facilities, [facility] is pleased to partner with third parties to provide [facility] patients or other authorized individuals the option of convenient, on-demand ground transportation services (the Chiropractor") through use of the technology service that enables users to request on-demand ground transportation from independent third-party providers.  By opting to use and accept these [employer], I, the undersigned, hereby agree on behalf of myself, and on behalf of any minor child using the [employer] for whom I am the parent or legal guardian, as follows:  1. Science writer provided to me are provided by independent third-party transportation providers who are not [employer] or employees and who are unaffiliated with [employer]. 2. [facility] is neither a transportation carrier nor a common or public carrier. 3. [facility] has no control over the quality or safety of the transportation that occurs as a result of the [employer]. 4. [facility] cannot guarantee that any third-party transportation provider will complete any arranged transportation service. 5. [facility] makes no representation, warranty, or guarantee regarding the reliability, timeliness, quality, safety, suitability, or availability of any of the Transport Services or that they will be error free. 6. I fully understand that traveling by vehicle involves risks and dangers of serious bodily injury, including permanent disability, paralysis, and death. I agree, on behalf of myself and on behalf of any minor child using the Transport Services for whom I am the parent or legal guardian, that the entire risk arising out of my use of the [employer] remains solely with me, to the maximum extent permitted under applicable law. 7. The [employer] are provided "as is" and "as available." [facility] disclaims all representations and warranties, express, implied or statutory, not expressly set out in these terms, including the implied warranties of merchantability and fitness for a particular purpose. 8. I hereby waive and release [facility], its agents, employees, officers, directors, representatives, insurers, attorneys, assigns, successors, subsidiaries, and affiliates from any and all past, present, or future claims, demands, liabilities, actions, causes of action, or suits of any kind directly or indirectly arising from acceptance and use of the [employer]. 9. I further waive and release  and its affiliates from all present and future liability and responsibility for any injury or death to persons or damages to property caused by or related to the use of the [employer]. 10. I have read this Waiver and Release of Liability, and I understand the terms used in it and their legal significance. This Waiver is freely and voluntarily given with the understanding that my right (as well as the right of any minor child for whom I am the parent or legal guardian using the [employer]) to legal recourse against  in connection with the [employer] is knowingly surrendered in return for use of these services.   I attest that I read the consent document to [name], gave Ms. Tretter the opportunity to ask questions and answered the questions asked (if any). I affirm that [name] then provided consent for she's participation in this program.     Bonnie Alexander

## 2020-03-08 DIAGNOSIS — M797 Fibromyalgia: Secondary | ICD-10-CM | POA: Diagnosis not present

## 2020-03-08 DIAGNOSIS — U071 COVID-19: Secondary | ICD-10-CM | POA: Diagnosis not present

## 2020-03-08 DIAGNOSIS — G4733 Obstructive sleep apnea (adult) (pediatric): Secondary | ICD-10-CM | POA: Diagnosis not present

## 2020-03-08 DIAGNOSIS — J939 Pneumothorax, unspecified: Secondary | ICD-10-CM | POA: Diagnosis not present

## 2020-03-08 DIAGNOSIS — K219 Gastro-esophageal reflux disease without esophagitis: Secondary | ICD-10-CM | POA: Diagnosis not present

## 2020-03-08 DIAGNOSIS — M8580 Other specified disorders of bone density and structure, unspecified site: Secondary | ICD-10-CM | POA: Diagnosis not present

## 2020-03-08 DIAGNOSIS — F419 Anxiety disorder, unspecified: Secondary | ICD-10-CM | POA: Diagnosis not present

## 2020-03-08 DIAGNOSIS — Z7951 Long term (current) use of inhaled steroids: Secondary | ICD-10-CM | POA: Diagnosis not present

## 2020-03-08 DIAGNOSIS — J9601 Acute respiratory failure with hypoxia: Secondary | ICD-10-CM | POA: Diagnosis not present

## 2020-03-08 DIAGNOSIS — R42 Dizziness and giddiness: Secondary | ICD-10-CM | POA: Diagnosis not present

## 2020-03-08 DIAGNOSIS — E785 Hyperlipidemia, unspecified: Secondary | ICD-10-CM | POA: Diagnosis not present

## 2020-03-08 DIAGNOSIS — J1282 Pneumonia due to coronavirus disease 2019: Secondary | ICD-10-CM | POA: Diagnosis not present

## 2020-03-10 DIAGNOSIS — J9601 Acute respiratory failure with hypoxia: Secondary | ICD-10-CM | POA: Diagnosis not present

## 2020-03-10 DIAGNOSIS — F419 Anxiety disorder, unspecified: Secondary | ICD-10-CM | POA: Diagnosis not present

## 2020-03-10 DIAGNOSIS — U071 COVID-19: Secondary | ICD-10-CM | POA: Diagnosis not present

## 2020-03-10 DIAGNOSIS — M797 Fibromyalgia: Secondary | ICD-10-CM | POA: Diagnosis not present

## 2020-03-10 DIAGNOSIS — J939 Pneumothorax, unspecified: Secondary | ICD-10-CM | POA: Diagnosis not present

## 2020-03-10 DIAGNOSIS — J1282 Pneumonia due to coronavirus disease 2019: Secondary | ICD-10-CM | POA: Diagnosis not present

## 2020-03-14 DIAGNOSIS — J1282 Pneumonia due to coronavirus disease 2019: Secondary | ICD-10-CM | POA: Diagnosis not present

## 2020-03-14 DIAGNOSIS — J939 Pneumothorax, unspecified: Secondary | ICD-10-CM | POA: Diagnosis not present

## 2020-03-14 DIAGNOSIS — M797 Fibromyalgia: Secondary | ICD-10-CM | POA: Diagnosis not present

## 2020-03-14 DIAGNOSIS — J9601 Acute respiratory failure with hypoxia: Secondary | ICD-10-CM | POA: Diagnosis not present

## 2020-03-14 DIAGNOSIS — F419 Anxiety disorder, unspecified: Secondary | ICD-10-CM | POA: Diagnosis not present

## 2020-03-14 DIAGNOSIS — U071 COVID-19: Secondary | ICD-10-CM | POA: Diagnosis not present

## 2020-03-16 DIAGNOSIS — U071 COVID-19: Secondary | ICD-10-CM | POA: Diagnosis not present

## 2020-03-16 DIAGNOSIS — J939 Pneumothorax, unspecified: Secondary | ICD-10-CM | POA: Diagnosis not present

## 2020-03-16 DIAGNOSIS — J1282 Pneumonia due to coronavirus disease 2019: Secondary | ICD-10-CM | POA: Diagnosis not present

## 2020-03-16 DIAGNOSIS — M797 Fibromyalgia: Secondary | ICD-10-CM | POA: Diagnosis not present

## 2020-03-16 DIAGNOSIS — F419 Anxiety disorder, unspecified: Secondary | ICD-10-CM | POA: Diagnosis not present

## 2020-03-16 DIAGNOSIS — J9601 Acute respiratory failure with hypoxia: Secondary | ICD-10-CM | POA: Diagnosis not present

## 2020-03-21 DIAGNOSIS — J939 Pneumothorax, unspecified: Secondary | ICD-10-CM | POA: Diagnosis not present

## 2020-03-21 DIAGNOSIS — M797 Fibromyalgia: Secondary | ICD-10-CM | POA: Diagnosis not present

## 2020-03-21 DIAGNOSIS — U071 COVID-19: Secondary | ICD-10-CM | POA: Diagnosis not present

## 2020-03-21 DIAGNOSIS — J9601 Acute respiratory failure with hypoxia: Secondary | ICD-10-CM | POA: Diagnosis not present

## 2020-03-21 DIAGNOSIS — F419 Anxiety disorder, unspecified: Secondary | ICD-10-CM | POA: Diagnosis not present

## 2020-03-21 DIAGNOSIS — J1282 Pneumonia due to coronavirus disease 2019: Secondary | ICD-10-CM | POA: Diagnosis not present

## 2020-03-23 ENCOUNTER — Ambulatory Visit (INDEPENDENT_AMBULATORY_CARE_PROVIDER_SITE_OTHER): Payer: Medicare Other

## 2020-03-23 ENCOUNTER — Other Ambulatory Visit: Payer: Self-pay

## 2020-03-23 ENCOUNTER — Telehealth: Payer: Self-pay | Admitting: Internal Medicine

## 2020-03-23 ENCOUNTER — Ambulatory Visit (INDEPENDENT_AMBULATORY_CARE_PROVIDER_SITE_OTHER): Payer: Medicare Other | Admitting: Internal Medicine

## 2020-03-23 ENCOUNTER — Encounter: Payer: Self-pay | Admitting: Internal Medicine

## 2020-03-23 VITALS — BP 110/60 | HR 106 | Temp 98.3°F | Ht 61.0 in | Wt 146.4 lb

## 2020-03-23 DIAGNOSIS — J939 Pneumothorax, unspecified: Secondary | ICD-10-CM | POA: Insufficient documentation

## 2020-03-23 DIAGNOSIS — R7989 Other specified abnormal findings of blood chemistry: Secondary | ICD-10-CM

## 2020-03-23 DIAGNOSIS — J9601 Acute respiratory failure with hypoxia: Secondary | ICD-10-CM | POA: Diagnosis not present

## 2020-03-23 NOTE — Telephone Encounter (Signed)
Spoke with pt. She is aware of the below information. Pt was driving at the time of the call. States that she will call back when she gets home to get the information for the [facility] lab. Order for d dimer has been placed.

## 2020-03-23 NOTE — Progress Notes (Signed)
OV 03/23/2020  Subjective:  Patient ID: Bonnie Alexander, female , DOB: 1951/03/14 , age 69 y.o. , MRN: [id number] , ADDRESS: [id number] [place] [address]   03/23/2020 -   Chief Complaint  Patient presents with  . Consult    Pt is being referred after being hospitalized with covid-19 pneumonia as well as a pneumothorax.  Pt states she is currently doing PT to help gain strength back. Pt denies any real complaints of SOB; has an occ cough due to postnasal drip.     HPI Bonnie Alexander 69 y.o. - has a past medical history of Abdominal pain, Anxiety, Chest pain, Colon polyp, Fibromyalgia, GERD (gastroesophageal reflux disease), Headache, Hyperlipidemia, OSA (obstructive sleep apnea), Osteopenia, and Vertigo.   reports that she has never smoked. She has never used smokeless tobacco.   Presents for evaluation after being hospitalized with covid-19 pneumonia complicated by pneumothorax. Hospitalized on 4/9, for 12  Days. Given Remdesivir x 5 days, dexamethasone x 10 days, and toculizimab. She came out of the hospital weak and needed a cain for balance, she has improved with home PT. Denies any SOB but notes she gets tired with going out to stores. Has a cough periodically, but this is typical with her cougt/ She has a history of fibromyalgia and says this is not new. Patient has been using combivent and has noticed shaking with this medication.     Past Surgical History:  Procedure Laterality Date  . SHOULDER ARTHROSCOPY W/ ROTATOR CUFF REPAIR      Allergies  Allergen Reactions  . Celexa [Citalopram Hydrobromide]   . Ciprofibrate Other (See Comments)    Pain in arms and legs  . Claritin [Loratadine]   . Omeprazole   . Prednisone     Chest pain   . Sulfa Antibiotics     Immunization History  Administered Date(s) Administered  . Influenza, High Dose Seasonal PF 09/19/2019    Family History  Problem Relation Age of Onset  . Stroke Mother   . Cancer Mother   . Heart  disease Father      Current Outpatient Medications:  .  budesonide (RHINOCORT AQUA) 32 MCG/ACT nasal spray, Place 1 spray into both nostrils daily. , Disp: , Rfl:  .  calcium carbonate (OS-CAL) 1250 (500 Ca) MG chewable tablet, Chew 1 tablet by mouth daily. gummy, Disp: , Rfl:  .  estradiol (ESTRACE) 0.1 MG/GM vaginal cream, Place 1 Applicatorful vaginally 2 (two) times a week., Disp: , Rfl:  .  meloxicam (MOBIC) 7.5 MG tablet, Take 7.5 mg by mouth as needed for pain., Disp: , Rfl:  .  Multiple Vitamin (MULTIVITAMIN) tablet, Take 1 tablet by mouth daily. Gummy, Disp: , Rfl:  .  trimethoprim (TRIMPEX) 100 MG tablet, Take 100 mg by mouth daily., Disp: , Rfl:  .  vitamin C (ASCORBIC ACID) 250 MG tablet, Take 250 mg by mouth daily. gummy, Disp: , Rfl:  .  fluticasone (FLONASE) 50 MCG/ACT nasal spray, Place 2 sprays into both nostrils daily., Disp: , Rfl:  .  Ipratropium-Albuterol (COMBIVENT) 20-100 MCG/ACT AERS respimat, Inhale 1 puff into the lungs every 6 (six) hours as needed for wheezing or shortness of breath. (Patient not taking: Reported on 03/23/2020), Disp: 4 g, Rfl: 0      Objective:   Vitals:   03/23/20 1338  BP: 110/60  Pulse: (!) 106  Temp: 98.3 F (36.8 C)  TempSrc: Temporal  SpO2: 93%  Weight: 146 lb 6.4 oz (  66.4 kg)  Height: 5\' 1"  (1.549 m)    Estimated body mass index is 27.66 kg/m as calculated from the following:   Height as of this encounter: 5\' 1"  (1.549 m).   Weight as of this encounter: 146 lb 6.4 oz (66.4 kg).  @WEIGHTCHANGE @    03/23/20 1338  Weight: 146 lb 6.4 oz (66.4 kg)   Physical Exam General: NAD, nl appearance HE: Normocephalic, atraumatic , EOMI, Conjunctivae normal ENT: No congestion, no rhinorrhea, no exudate or erythema  Cardiovascular: Tachycardic, regular rhythm.  No murmurs, rubs, or gallops Pulmonary : decreased breath sounds at the bases. No wheezes, rales, or rhonchi Abdominal: soft, nontender,  bowel sounds  present Musculoskeletal: no swelling , deformity, injury ,or tenderness in extremities, Skin: Warm, dry , no bruising, erythema, or rash Psychiatric/Behavioral:  normal mood, normal behavior   Assessment:    Patient's symptoms have improved since her hospital stay. Personally review her chest xrays from 4/20  which showed stable pneumothorax at right lung base compared to images taken on prior days. Also  Noted some blunting of left costophrenic angle with a meniscus, possible fluid vs atelectasis. Will obtain chest xray today.  Patient should continue to work with physical therapy To improve deconditioning from hospital stay.     Plan:  - stop combivent inhaler - Walk test - 2 view chest xray  SIGNATURE   [employer], MD PGY1    ATTESTATION & SIGNATURE   STAFF NOTE: I, Dr 05/23/20 have personally reviewed patient's available data, including medical history, events of note, physical examination and test results as part of my evaluation. I have discussed with resident/NP and other care providers such as pharmacist, RN and RRT.  In addition,  I personally evaluated patient and elicited key findings of   S: Date of admit (Not on file) with LOS 0 for today 03/23/2020 : Bonnie Alexander is   -hospitalized with COVID-19 last month.  Prior to that only sleep apnea.  Treated with oxygen and standard protocol.  Not on mechanical ventilation.  Course complicated with right-sided pneumothorax at discharge chest x-ray is approximately 15 days ago shows loculated very small right pneumothorax.  There is also left-sided atelectasis versus pleural effusion at discharge.  She is here for follow-up.  Overall course is 1 of improvement but she has significant fatigue while doing stairs and shopping relieved by rest.  There is no chest pain.  She was given Symbicort in the hospital but this is making her cough more.  She does not like it.  She has questions about being exposed to painting at home.  Her  bathroom and other areas in the house are getting renovated.  She does not want to wait too long.  She has been using flutter valve at home  O:  Blood pressure 110/60, pulse (!) 106, temperature 98.3 F (36.8 C), temperature source Temporal, height 5\' 1"  (1.549 m), weight 146 lb 6.4 oz (66.4 kg), SpO2 93 %.   Nonfocal exam with equally good air entry.  Looks somewhat deconditioned.  Simple office walk 185 feet x  3 laps goal with forehead probe 03/23/2020   O2 used ra  Number laps completed 3  Comments about pace slow  Resting Pulse Ox/HR 98% and 112/min  Final Pulse Ox/HR 95% and 134/min  Desaturated </= 88% no  Desaturated <= 3% points Yes, 3 points  Got Tachycardic >/= 90/min yes  Symptoms at end of test Mild dyspnea and fatigue  Miscellaneous comments x       LABS    PULMONARY No results for input(s): PHART, PCO2ART, PO2ART, HCO3, TCO2, O2SAT in the last 168 hours.  Invalid input(s): PCO2, PO2  CBC No results for input(s): HGB, HCT, WBC, PLT in the last 168 hours.  COAGULATION No results for input(s): INR in the last 168 hours.  CARDIAC  No results for input(s): TROPONINI in the last 168 hours. No results for input(s): PROBNP in the last 168 hours.   CHEMISTRY No results for input(s): NA, K, CL, CO2, GLUCOSE, BUN, CREATININE, CALCIUM, MG, PHOS in the last 168 hours. Estimated Creatinine Clearance: 60.3 mL/min (by C-G formula based on SCr of 0.65 mg/dL).   LIVER No results for input(s): AST, ALT, ALKPHOS, BILITOT, PROT, ALBUMIN, INR in the last 168 hours.   INFECTIOUS No results for input(s): LATICACIDVEN, PROCALCITON in the last 168 hours.   ENDOCRINE CBG (last 3)  No results for input(s): GLUCAP in the last 72 hours.       IMAGING x24h  - image(s) personally visualized  -   highlighted in bold No results found.    A: Covid complicated by small loculated pneumothorax and left-sided atelectasis. -Course is one of the improvement  P: Get  chest x-ray two-view today.  If the pneumothorax is persistent then avoid lifting and heavy straining and flutter valve any positive pressure movements.  She can stop Combivent  Probably call back and get a d-dimer followup  Anti-infectives (From admission, onward)   None       Rest per NP/medical resident whose note is outlined above and that I agree with =   Dr. Brand Males, M.D., [facility].C.P Pulmonary and Critical Care Medicine Staff Physician [facility] Pager: [phone], If no answer or between  15:00h - 7:00h: call [phone]  03/23/2020 3:11 PM

## 2020-03-23 NOTE — Telephone Encounter (Signed)
Her ddimer at dc from covid was 0.77 and slightly high. Good to track it. Sorry forgot to tell her that. THis is blood clot reisk. At her conveniene next few days go check d-dimer . If high will get Duplex LE

## 2020-03-23 NOTE — Patient Instructions (Addendum)
1. Acute hypoxemic respiratory failure (HCC)   2. Pneumothorax, unspecified type      1. Acute hypoxemic respiratory failure (HCC) This problem is resolved. You maintained oxygen saturation with walking, but Limited by fatigue. - You can stop the combivent inhaler  2. Pneumothorax  - 2 view chest xray - it is okay to lay down on right side - Stop using flutter valve - avoid heavy lifting or straining until pneumothorax has resolved, will know by chest xray  Follow up in 6 months or will schedule sooner pending results of chest xray

## 2020-03-24 DIAGNOSIS — J9601 Acute respiratory failure with hypoxia: Secondary | ICD-10-CM | POA: Diagnosis not present

## 2020-03-24 DIAGNOSIS — J939 Pneumothorax, unspecified: Secondary | ICD-10-CM | POA: Diagnosis not present

## 2020-03-24 DIAGNOSIS — M797 Fibromyalgia: Secondary | ICD-10-CM | POA: Diagnosis not present

## 2020-03-24 DIAGNOSIS — F419 Anxiety disorder, unspecified: Secondary | ICD-10-CM | POA: Diagnosis not present

## 2020-03-24 DIAGNOSIS — J1282 Pneumonia due to coronavirus disease 2019: Secondary | ICD-10-CM | POA: Diagnosis not present

## 2020-03-24 DIAGNOSIS — U071 COVID-19: Secondary | ICD-10-CM | POA: Diagnosis not present

## 2020-03-27 ENCOUNTER — Telehealth: Payer: Self-pay | Admitting: Internal Medicine

## 2020-03-27 DIAGNOSIS — J939 Pneumothorax, unspecified: Secondary | ICD-10-CM | POA: Diagnosis not present

## 2020-03-27 DIAGNOSIS — J1282 Pneumonia due to coronavirus disease 2019: Secondary | ICD-10-CM | POA: Diagnosis not present

## 2020-03-27 DIAGNOSIS — J9601 Acute respiratory failure with hypoxia: Secondary | ICD-10-CM | POA: Diagnosis not present

## 2020-03-27 DIAGNOSIS — U071 COVID-19: Secondary | ICD-10-CM | POA: Diagnosis not present

## 2020-03-27 DIAGNOSIS — M797 Fibromyalgia: Secondary | ICD-10-CM | POA: Diagnosis not present

## 2020-03-27 DIAGNOSIS — F419 Anxiety disorder, unspecified: Secondary | ICD-10-CM | POA: Diagnosis not present

## 2020-03-27 NOTE — Telephone Encounter (Signed)
stil with small loculated right ptx on cxr 5/6  Plan  - get duplex LE for high d-dimer - avoid straining and lifting heavy objects - rov in 6 weeks with cxr

## 2020-03-27 NOTE — Telephone Encounter (Signed)
I meant we get her d-dimer blood test tomorrow (not duplex LE)  Also probably get HRCT next few weeks   Ok I can try to call her after d-dimer

## 2020-03-27 NOTE — Telephone Encounter (Signed)
Dr. Marchelle Gearing please advise on patient CXR from 5/6

## 2020-03-27 NOTE — Telephone Encounter (Signed)
Spoke with pt, she is going to get her D-Dimer in the morning. She is concerned about the "scarring" in her lungs. She wants to know how dense it is and she read on the internet that it was irreversible. Dr. Marchelle Gearing please call patient after he D-Dimer test is resulted to discuss with pt. I tried to answer her questions to the best of my ability.

## 2020-03-28 ENCOUNTER — Other Ambulatory Visit: Payer: Medicare Other

## 2020-03-28 DIAGNOSIS — R7989 Other specified abnormal findings of blood chemistry: Secondary | ICD-10-CM

## 2020-03-28 NOTE — Telephone Encounter (Signed)
Order for HRCT ordered. Patient has got D-dimer drawn this morning. Dr. Marchelle Gearing will call her once he gets results to discuss with patient. He has already routed this to himself for follow up.

## 2020-03-28 NOTE — Telephone Encounter (Signed)
Pt had d dimer labwork collected. Nothing further needed.

## 2020-03-29 ENCOUNTER — Other Ambulatory Visit (HOSPITAL_BASED_OUTPATIENT_CLINIC_OR_DEPARTMENT_OTHER): Payer: Self-pay | Admitting: Internal Medicine

## 2020-03-29 ENCOUNTER — Telehealth: Payer: Self-pay | Admitting: Internal Medicine

## 2020-03-29 DIAGNOSIS — J9601 Acute respiratory failure with hypoxia: Secondary | ICD-10-CM | POA: Diagnosis not present

## 2020-03-29 DIAGNOSIS — R7989 Other specified abnormal findings of blood chemistry: Secondary | ICD-10-CM

## 2020-03-29 DIAGNOSIS — U071 COVID-19: Secondary | ICD-10-CM

## 2020-03-29 DIAGNOSIS — J939 Pneumothorax, unspecified: Secondary | ICD-10-CM | POA: Diagnosis not present

## 2020-03-29 DIAGNOSIS — J1282 Pneumonia due to coronavirus disease 2019: Secondary | ICD-10-CM | POA: Diagnosis not present

## 2020-03-29 DIAGNOSIS — M797 Fibromyalgia: Secondary | ICD-10-CM | POA: Diagnosis not present

## 2020-03-29 DIAGNOSIS — F419 Anxiety disorder, unspecified: Secondary | ICD-10-CM | POA: Diagnosis not present

## 2020-03-29 LAB — D-DIMER, QUANTITATIVE: D-Dimer, Quant: 2.08 mcg/mL FEU — ABNORMAL HIGH (ref <0.50)

## 2020-03-29 NOTE — Telephone Encounter (Signed)
Spoke with patient. She is aware of the d-dimer results and needing to have the CT changed. She wanted to know if she could have the dopplers done at the same place on same day. I advised her that I would ask the PCCs. She verbalized understanding.   She also wanted to know about the scarring that showed up on her previous CXR. She stated that she had asked this question on Monday but never got an answer, just more tests that were ordered. Advised her that I route this message to MR for her.    MR, please advise about the scarring.   PCCs, is there anyway we can change the CT that is scheduled for tomorrow and add dopplers? Thanks!

## 2020-03-29 NOTE — Telephone Encounter (Signed)
Spoke to p[atient  - updated that cxr does show scar tissue now compared to during admission a month ago. Also explained that unlikely that nasal spay will not result in high d-dimer  - d-dimer also  High  Plan Await CT A chest tomorrow (has had contrast iwhtout prob before) - last bmet was 03/07/20 and normal (might need bmet before CTA)   Allergies  Allergen Reactions  . Celexa [Citalopram Hydrobromide]   . Ciprofibrate Other (See Comments)    Pain in arms and legs  . Claritin [Loratadine]   . Omeprazole   . Prednisone     Chest pain   . Sulfa Antibiotics    Results for MICHEALE, SCHLACK (MRN [id number]) as of 03/29/2020 13:33  Ref. Range 03/07/2020 04:24  Creatinine Latest Ref Range: 0.44 - 1.00 mg/dL 2.80

## 2020-03-29 NOTE — Telephone Encounter (Signed)
[facility] has everything changed and scheduled for the patient for 03/30/20. CT is being done 1st then the Venous dopplers. Bonnie Alexander called the patient but I also spoke with the patient just to make sure she was told about the appts.

## 2020-03-29 NOTE — Telephone Encounter (Signed)
Results for CAMARIE, MCTIGUE (MRN [id number]) as of 03/29/2020 11:30  Ref. Range 02/27/2020 04:10 02/28/2020 04:02 02/29/2020 04:41 03/01/2020 05:03 03/28/2020 09:25  D-Dimer, Sharene Butters Latest Ref Range: <0.50 mcg/mL FEU 0.58 (H) 0.52 (H) 0.61 (H) 0.77 (H) 2.08 (H)    D-ddimer up from post discharge  Plan  =- change CT Chest without contrast schedule for 03/30/20 to CTA PE protocol - can be done 03/29/2020 or 03/30/20  - also get duplex LE sometime next few to several days  - any worsening go to ER

## 2020-03-30 ENCOUNTER — Telehealth: Payer: Self-pay | Admitting: Internal Medicine

## 2020-03-30 ENCOUNTER — Ambulatory Visit (HOSPITAL_BASED_OUTPATIENT_CLINIC_OR_DEPARTMENT_OTHER)
Admission: RE | Admit: 2020-03-30 | Discharge: 2020-03-30 | Disposition: A | Discharge status: Home / Self Care | Payer: Medicare Other | Source: Ambulatory Visit | Attending: Internal Medicine | Admitting: Internal Medicine

## 2020-03-30 ENCOUNTER — Ambulatory Visit (HOSPITAL_BASED_OUTPATIENT_CLINIC_OR_DEPARTMENT_OTHER): Payer: Medicare Other

## 2020-03-30 ENCOUNTER — Encounter (HOSPITAL_BASED_OUTPATIENT_CLINIC_OR_DEPARTMENT_OTHER): Payer: Self-pay

## 2020-03-30 ENCOUNTER — Other Ambulatory Visit: Payer: Self-pay

## 2020-03-30 DIAGNOSIS — U071 COVID-19: Secondary | ICD-10-CM | POA: Insufficient documentation

## 2020-03-30 DIAGNOSIS — R7989 Other specified abnormal findings of blood chemistry: Secondary | ICD-10-CM | POA: Insufficient documentation

## 2020-03-30 DIAGNOSIS — R0602 Shortness of breath: Secondary | ICD-10-CM | POA: Diagnosis not present

## 2020-03-30 MED ORDER — IOHEXOL 350 MG/ML SOLN
100.0000 mL | Freq: Once | INTRAVENOUS | Status: AC | PRN
  Administered 2020-03-30: 100 mL via INTRAVENOUS

## 2020-03-30 NOTE — Telephone Encounter (Signed)
Telephone visit to discuss Ct results 03/30/2020 for Bonnie Alexander 12/13/1950 - patient identified  Issues discussed  1. D-dimer elevation - no dvt . No PE -> adivsed if she wants she can do baby aspirin for few weeks and clinical monitoring of respiratory status and legs  2. 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 Hiatal hernia - she suspected about iit because of gerd -> she takes pepcid and diet control -> advised to continue good control.   4. Fatty liver- she knows about it  5. Coronary artery calcification - says negative stress test x 10 years ago. No chest pain when she walks. Normal cath per hx 10 years ago. Has pain on and off  Fibromyalgia. Advised to talk to PCP Lawerance Cruel, MD  Followp  - 6 month followup and then can decide on imaging etc., at time of followup      SIGNATURE    Dr. Brand Males, M.D., F.C.C.P,  Pulmonary and Critical Care Medicine Staff Physician, [place] Director - Interstitial Lung Disease  Program  Pulmonary [place] at [place], [place], [address]  Pager: [phone], If no answer or between  15:00h - 7:00h: call [phone] Telephone: [phone]  5:59 PM 03/30/2020     Current Outpatient Medications:  .  budesonide (RHINOCORT AQUA) 32 MCG/ACT nasal spray, Place 1 spray into both nostrils daily. , Disp: , Rfl:  .  calcium carbonate (OS-CAL) 1250 (500 Ca) MG chewable tablet, Chew 1 tablet by mouth daily. gummy, Disp: , Rfl:  .  estradiol (ESTRACE) 0.1 MG/GM vaginal cream, Place 1 Applicatorful vaginally 2 (two) times a week., Disp: , Rfl:  .  fluticasone (FLONASE) 50 MCG/ACT nasal spray, Place 2 sprays into both nostrils daily., Disp: , Rfl:  .   Ipratropium-Albuterol (COMBIVENT) 20-100 MCG/ACT AERS respimat, Inhale 1 puff into the lungs every 6 (six) hours as needed for wheezing or shortness of breath. (Patient not taking: Reported on 03/23/2020), Disp: 4 g, Rfl: 0 .  meloxicam (MOBIC) 7.5 MG tablet, Take 7.5 mg by mouth as needed for pain., Disp: , Rfl:  .  Multiple Vitamin (MULTIVITAMIN) tablet, Take 1 tablet by mouth daily. Gummy, Disp: , Rfl:  .  trimethoprim (TRIMPEX) 100 MG tablet, Take 100 mg by mouth daily., Disp: , Rfl:  .  vitamin C (ASCORBIC ACID) 250 MG tablet, Take 250 mg by mouth daily. gummy, Disp: , Rfl:   Allergies  Allergen Reactions  . Celexa [Citalopram Hydrobromide]   . Ciprofibrate Other (See Comments)    Pain in arms and legs  . Claritin [Loratadine]   . Omeprazole   . Prednisone     Chest pain   . Sulfa Antibiotics      Results for JONITA, HIROTA (MRN [id number]) as of 03/30/2020 17:48  Ref. Range 03/28/2020 09:25  D-Dimer, Quant Latest Ref Range: <0.50 mcg/mL FEU 2.08 (H)    CT Angio Chest W/Cm &/Or Wo Cm  Result Date: 03/30/2020 CLINICAL DATA:  Shortness of breath. Recent COVID-19 positive. Positive D-dimer EXAM: CT ANGIOGRAPHY CHEST WITH CONTRAST TECHNIQUE: Multidetector CT imaging of the chest was performed using the standard protocol during bolus administration of intravenous contrast. Multiplanar  CT image reconstructions and MIPs were obtained to evaluate the vascular anatomy. CONTRAST:  OMNIPAQUE IOHEXOL 350 MG/ML SOLN COMPARISON:  Chest radiograph Mar 23, 2020 FINDINGS: Cardiovascular: There is no demonstrable pulmonary embolus. There is no thoracic aortic aneurysm or dissection. The visualized great vessels appear unremarkable. Note that the right innominate and left common carotid arteries arise as a common trunk, an anatomic variant. There is aortic atherosclerosis. There are occasional foci of coronary artery calcification. There is no pericardial effusion or pericardial thickening.  Mediastinum/Nodes: Thyroid appears normal. There is no appreciable thoracic adenopathy. There is a small hiatal hernia. Lungs/Pleura: There is apparent scarring in the lung bases. There are areas of ill-defined, primarily peripheral in location foci of airspace opacity in both upper and lower lobe regions as well as in the lingula and right middle lobe regions. Minimal pleural effusions noted bilaterally. No frank consolidation. Upper Abdomen: There is hepatic steatosis. Visualized upper abdominal structures otherwise appear unremarkable. Musculoskeletal: No blastic or lytic bone lesions. No chest wall lesions. Review of the MIP images confirms the above findings. IMPRESSION: 1. No evident pulmonary embolus. No thoracic aortic aneurysm or dissection. There is aortic atherosclerosis as well as foci of coronary artery calcification. 2. Areas of scarring bilaterally with scattered areas of airspace opacity consistent with multifocal pneumonia. Suspect atypical organism pneumonia given the history. Minimal pleural effusions bilaterally. 3.  No evident adenopathy. 4.  Hepatic steatosis. 5.  Small hiatal hernia. Aortic Atherosclerosis (ICD10-I70.0). Electronically Signed   By: Bretta Bang III M.D.   On: 03/30/2020 10:53   US Venous Img Lower Bilateral (DVT)  Result Date: 03/30/2020 CLINICAL DATA:  Elevated D-dimer. History of COVID-19 virus infection. EXAM: BILATERAL LOWER EXTREMITY VENOUS DOPPLER ULTRASOUND TECHNIQUE: Gray-scale sonography with graded compression, as well as color Doppler and duplex ultrasound were performed to evaluate the lower extremity deep venous systems from the level of the common femoral vein and including the common femoral, femoral, profunda femoral, popliteal and calf veins including the posterior tibial, peroneal and gastrocnemius veins when visible. The superficial great saphenous vein was also interrogated. Spectral Doppler was utilized to evaluate flow at rest and with distal  augmentation maneuvers in the common femoral, femoral and popliteal veins. COMPARISON:  None. FINDINGS: RIGHT LOWER EXTREMITY Common Femoral Vein: No evidence of thrombus. Normal compressibility, respiratory phasicity and color Doppler flow. Saphenofemoral Junction: No evidence of thrombus. Normal compressibility and flow on color Doppler imaging. Profunda Femoral Vein: No evidence of thrombus. Normal compressibility and flow on color Doppler imaging. Femoral Vein: No evidence of thrombus. Normal compressibility, respiratory phasicity and color Doppler flow. Popliteal Vein: No evidence of thrombus. Normal compressibility, respiratory phasicity and color Doppler flow. Calf Veins: Visualized right deep calf veins are patent without thrombus. Other Findings:  None. LEFT LOWER EXTREMITY Common Femoral Vein: No evidence of thrombus. Normal compressibility, respiratory phasicity and response to augmentation. Saphenofemoral Junction: No evidence of thrombus. Normal compressibility and flow on color Doppler imaging. Profunda Femoral Vein: No evidence of thrombus. Normal compressibility and flow on color Doppler imaging. Femoral Vein: No evidence of thrombus. Normal compressibility, respiratory phasicity and response to augmentation. Popliteal Vein: No evidence of thrombus. Normal compressibility, respiratory phasicity and response to augmentation. Calf Veins: Visualized left deep calf veins are patent without thrombus. Other Findings:  None. IMPRESSION: No evidence of deep venous thrombosis in either lower extremity. Electronically Signed   By: Richarda Overlie M.D.   On: 03/30/2020 12:11

## 2020-03-31 NOTE — Telephone Encounter (Signed)
Recall had already been placed after pt's last appt. Nothing further needed.

## 2020-04-03 DIAGNOSIS — J9601 Acute respiratory failure with hypoxia: Secondary | ICD-10-CM | POA: Diagnosis not present

## 2020-04-03 DIAGNOSIS — F419 Anxiety disorder, unspecified: Secondary | ICD-10-CM | POA: Diagnosis not present

## 2020-04-03 DIAGNOSIS — U071 COVID-19: Secondary | ICD-10-CM | POA: Diagnosis not present

## 2020-04-03 DIAGNOSIS — M797 Fibromyalgia: Secondary | ICD-10-CM | POA: Diagnosis not present

## 2020-04-03 DIAGNOSIS — J1282 Pneumonia due to coronavirus disease 2019: Secondary | ICD-10-CM | POA: Diagnosis not present

## 2020-04-03 DIAGNOSIS — J939 Pneumothorax, unspecified: Secondary | ICD-10-CM | POA: Diagnosis not present

## 2020-04-05 DIAGNOSIS — J939 Pneumothorax, unspecified: Secondary | ICD-10-CM | POA: Diagnosis not present

## 2020-04-05 DIAGNOSIS — J9601 Acute respiratory failure with hypoxia: Secondary | ICD-10-CM | POA: Diagnosis not present

## 2020-04-05 DIAGNOSIS — U071 COVID-19: Secondary | ICD-10-CM | POA: Diagnosis not present

## 2020-04-05 DIAGNOSIS — F419 Anxiety disorder, unspecified: Secondary | ICD-10-CM | POA: Diagnosis not present

## 2020-04-05 DIAGNOSIS — J1282 Pneumonia due to coronavirus disease 2019: Secondary | ICD-10-CM | POA: Diagnosis not present

## 2020-04-05 DIAGNOSIS — M797 Fibromyalgia: Secondary | ICD-10-CM | POA: Diagnosis not present

## 2020-04-07 DIAGNOSIS — U071 COVID-19: Secondary | ICD-10-CM | POA: Diagnosis not present

## 2020-04-07 DIAGNOSIS — J1282 Pneumonia due to coronavirus disease 2019: Secondary | ICD-10-CM | POA: Diagnosis not present

## 2020-04-07 DIAGNOSIS — M8580 Other specified disorders of bone density and structure, unspecified site: Secondary | ICD-10-CM | POA: Diagnosis not present

## 2020-04-07 DIAGNOSIS — M797 Fibromyalgia: Secondary | ICD-10-CM | POA: Diagnosis not present

## 2020-04-07 DIAGNOSIS — J939 Pneumothorax, unspecified: Secondary | ICD-10-CM | POA: Diagnosis not present

## 2020-04-07 DIAGNOSIS — F419 Anxiety disorder, unspecified: Secondary | ICD-10-CM | POA: Diagnosis not present

## 2020-04-07 DIAGNOSIS — G4733 Obstructive sleep apnea (adult) (pediatric): Secondary | ICD-10-CM | POA: Diagnosis not present

## 2020-04-07 DIAGNOSIS — K219 Gastro-esophageal reflux disease without esophagitis: Secondary | ICD-10-CM | POA: Diagnosis not present

## 2020-04-07 DIAGNOSIS — Z7951 Long term (current) use of inhaled steroids: Secondary | ICD-10-CM | POA: Diagnosis not present

## 2020-04-07 DIAGNOSIS — R42 Dizziness and giddiness: Secondary | ICD-10-CM | POA: Diagnosis not present

## 2020-04-07 DIAGNOSIS — J9601 Acute respiratory failure with hypoxia: Secondary | ICD-10-CM | POA: Diagnosis not present

## 2020-04-07 DIAGNOSIS — E785 Hyperlipidemia, unspecified: Secondary | ICD-10-CM | POA: Diagnosis not present

## 2020-04-20 DIAGNOSIS — N3020 Other chronic cystitis without hematuria: Secondary | ICD-10-CM | POA: Diagnosis not present

## 2020-04-20 DIAGNOSIS — N952 Postmenopausal atrophic vaginitis: Secondary | ICD-10-CM | POA: Diagnosis not present

## 2020-07-21 DIAGNOSIS — N3020 Other chronic cystitis without hematuria: Secondary | ICD-10-CM | POA: Diagnosis not present

## 2020-07-21 DIAGNOSIS — N952 Postmenopausal atrophic vaginitis: Secondary | ICD-10-CM | POA: Diagnosis not present

## 2020-07-27 DIAGNOSIS — R3915 Urgency of urination: Secondary | ICD-10-CM | POA: Diagnosis not present

## 2020-07-27 DIAGNOSIS — N952 Postmenopausal atrophic vaginitis: Secondary | ICD-10-CM | POA: Diagnosis not present

## 2020-07-27 DIAGNOSIS — R350 Frequency of micturition: Secondary | ICD-10-CM | POA: Diagnosis not present

## 2020-07-27 DIAGNOSIS — N3020 Other chronic cystitis without hematuria: Secondary | ICD-10-CM | POA: Diagnosis not present

## 2020-08-02 DIAGNOSIS — H903 Sensorineural hearing loss, bilateral: Secondary | ICD-10-CM | POA: Diagnosis not present

## 2020-08-02 DIAGNOSIS — H9313 Tinnitus, bilateral: Secondary | ICD-10-CM | POA: Diagnosis not present

## 2020-08-24 ENCOUNTER — Other Ambulatory Visit: Payer: Self-pay

## 2020-08-24 ENCOUNTER — Ambulatory Visit (INDEPENDENT_AMBULATORY_CARE_PROVIDER_SITE_OTHER): Payer: Medicare Other | Admitting: Internal Medicine

## 2020-08-24 ENCOUNTER — Encounter: Payer: Self-pay | Admitting: Internal Medicine

## 2020-08-24 VITALS — BP 110/64 | HR 85 | Temp 98.0°F | Ht 60.5 in | Wt 152.4 lb

## 2020-08-24 DIAGNOSIS — R7989 Other specified abnormal findings of blood chemistry: Secondary | ICD-10-CM

## 2020-08-24 DIAGNOSIS — J849 Interstitial pulmonary disease, unspecified: Secondary | ICD-10-CM | POA: Diagnosis not present

## 2020-08-24 DIAGNOSIS — Z8616 Personal history of COVID-19: Secondary | ICD-10-CM | POA: Diagnosis not present

## 2020-08-24 DIAGNOSIS — Z7185 Encounter for immunization safety counseling: Secondary | ICD-10-CM

## 2020-08-24 NOTE — Progress Notes (Signed)
OV 03/23/2020  Subjective:  Patient ID: Bonnie Alexander, female , DOB: [birth year] , age 69 y.o. , MRN: [id number] , ADDRESS: [address] [place] [address]   03/23/2020 -   Chief Complaint  Patient presents with  . Consult    Pt is being referred after being hospitalized with covid-19 pneumonia as well as a pneumothorax.  Pt states she is currently doing PT to help gain strength back. Pt denies any real complaints of SOB; has an occ cough due to postnasal drip.     HPI Bonnie Alexander 69 y.o. - has a past medical history of Abdominal pain, Anxiety, Chest pain, Colon polyp, Fibromyalgia, GERD (gastroesophageal reflux disease), Headache, Hyperlipidemia, OSA (obstructive sleep apnea), Osteopenia, and Vertigo.   reports that she has never smoked. She has never used smokeless tobacco.   Presents for evaluation after being hospitalized with covid-19 pneumonia complicated by pneumothorax. Hospitalized on 4/9, for 12  Days. Given Remdesivir x 5 days, dexamethasone x 10 days, and toculizimab. She came out of the hospital weak and needed a cain for balance, she has improved with home PT. Denies any SOB but notes she gets tired with going out to stores. Has a cough periodically, but this is typical with her cougt/ She has a history of fibromyalgia and says this is not new. Patient has been using combivent and has noticed shaking with this medication.      OV 08/24/2020  Subjective:  Patient ID: Bonnie Alexander, adult , DOB: [birth year] , age 34 y.o. , MRN: [id number] , ADDRESS: [address] [place] [address] PCP Daisy Floro, MD Relevant Other Providers: - x This Provider for this visit: Treatment Team:  Attending Provider: Kalman Shan, MD    08/24/2020 -   Chief Complaint  Patient presents with  . Follow-up    Pt states she has been doing okay since last visit and denies any real complaints.     HPI Bonnie Alexander 69 y.o. -presents for follow-up post Covid.   I saw her in May 2021.  At that time chest x-ray post Covid was concerning for pneumothorax.  And D-dimer was elevated we followed up with CT angiogram chest that showed she did not have a PE.  Duplex was negative for DVT.  CT chest since that showed she had post Covid ILD findings.  She now presents for follow-up.  She continues to do well.  Minimally symptomatic.  She occasionally feels chest tightness and she needs to take a deep breath but she is able to do quite a bit of exertion take care of her husband with cancer and do church activities all without any shortness of breath.  She said to have the flu shot but has deferred it for this visit.  She is not had Covid vaccine.  She has some chronic on and off mild ankle edema but other than that there is no evidence of any DVT.  Overall stable.  I visualized the images and showed this to her especially those from May 2021.     ROS - per HPI     has a past medical history of Abdominal pain, Anxiety, Chest pain, Colon polyp, Fibromyalgia, GERD (gastroesophageal reflux disease), Headache, Hyperlipidemia, OSA (obstructive sleep apnea), Osteopenia, and Vertigo.   reports that she has never smoked. She has never used smokeless tobacco.  Past Surgical History:  Procedure Laterality Date  . SHOULDER ARTHROSCOPY W/ ROTATOR CUFF REPAIR  Allergies  Allergen Reactions  . Celexa [Citalopram Hydrobromide]   . Ciprofibrate Other (See Comments)    Pain in arms and legs  . Claritin [Loratadine]   . Omeprazole   . Prednisone     Chest pain   . Sulfa Antibiotics     Immunization History  Administered Date(s) Administered  . Influenza, High Dose Seasonal PF 09/19/2019    Family History  Problem Relation Age of Onset  . Stroke Mother   . Cancer Mother   . Heart disease Father      Current Outpatient Medications:  .  budesonide (RHINOCORT AQUA) 32 MCG/ACT nasal spray, Place 1 spray into both nostrils daily. , Disp: , Rfl:  .  calcium  carbonate (OS-CAL) 1250 (500 Ca) MG chewable tablet, Chew 1 tablet by mouth daily. gummy, Disp: , Rfl:  .  estradiol (ESTRACE) 0.1 MG/GM vaginal cream, Place 1 Applicatorful vaginally 2 (two) times a week., Disp: , Rfl:  .  famotidine (PEPCID) 20 MG tablet, Take 20 mg by mouth 2 (two) times daily., Disp: , Rfl:  .  meloxicam (MOBIC) 7.5 MG tablet, Take 7.5 mg by mouth as needed for pain., Disp: , Rfl:  .  Multiple Vitamin (MULTIVITAMIN) tablet, Take 1 tablet by mouth daily. Gummy, Disp: , Rfl:  .  vitamin C (ASCORBIC ACID) 250 MG tablet, Take 250 mg by mouth daily. gummy, Disp: , Rfl:  .  trimethoprim (TRIMPEX) 100 MG tablet, Take 100 mg by mouth daily. (Patient not taking: Reported on 08/24/2020), Disp: , Rfl:       Objective:   Vitals:   08/24/20 1125  BP: 110/64  Pulse: 85  Temp: 98 F (36.7 C)  TempSrc: Other (Comment)  SpO2: 97%  Weight: 152 lb 6.4 oz (69.1 kg)  Height: 5' 0.5" (1.537 m)    Estimated body mass index is 29.27 kg/m as calculated from the following:   Height as of this encounter: 5' 0.5" (1.537 m).   Weight as of this encounter: 152 lb 6.4 oz (69.1 kg).  @WEIGHTCHANGE @    08/24/20 1125  Weight: 152 lb 6.4 oz (69.1 kg)     Physical Exam General: No distress. Looks wel Neuro: Alert and Oriented x 3. GCS 15. Speech normal Psych: Pleasant Resp:  Barrel Chest - no.  Wheeze - no, Crackles - no, No overt respiratory distress CVS: Normal heart sounds. Murmurs - no Ext: Stigmata of Connective Tissue Disease - no HEENT: Normal upper airway. PEERL +. No post nasal drip        Assessment:       ICD-10-CM   1. ILD (interstitial lung disease) (HCC)  J84.9   2. Personal history of COVID-19  Z86.16   3. Positive D dimer  R79.89   4. Vaccine counseling  Z71.85   5. Interstitial pulmonary disease (HCC)  J84.9 CT Chest High Resolution   Postinflammatory ILD.  Minimal and doing well.  Explained to her the natural progression and course is 1 of  improvement/resolution.  Rarely we see patients who occasionally over the years start getting worse.  At this point in time we resolved that we will just do a follow-up high-resolution CT chest in 1 year from now.  In between if there are problems she will contact 10/24/20.    Plan:     Patient Instructions     ICD-10-CM   1. ILD (interstitial lung disease) (HCC)  J84.9   2. Personal history of COVID-19  Z86.16  3. Positive D dimer  R79.89   4. Vaccine counseling  Z71.85      Doing well clinically No evidence of DVT/PE clinically No evidence of pneumothorax but density of ILD post Covid on May 2021 CT chest post   Plan  - do followup HRCT Oct 2022 - encourage covid vacine and flu shot  Followup  -r eturn Oct 2022 affter HRCT - 15 min slot fine     SIGNATURE    Dr. Kalman Shan, M.D., F.C.C.P,  Pulmonary and Critical Care Medicine Staff Physician, [facility] Director - Interstitial Lung Disease  Program  Pulmonary Fibrosis [facility] at [facility] [place], [place], [address]  Pager: [phone], If no answer or between  15:00h - 7:00h: call [phone] Telephone: [phone]  12:07 PM 08/24/2020

## 2020-08-24 NOTE — Patient Instructions (Addendum)
ICD-10-CM   1. ILD (interstitial lung disease) (HCC)  J84.9   2. Personal history of COVID-19  Z86.16   3. Positive D dimer  R79.89   4. Vaccine counseling  Z71.85      Doing well clinically No evidence of DVT/PE clinically No evidence of pneumothorax but density of ILD post Covid on May 2021 CT chest post   Plan  - do followup HRCT Oct 2022 - encourage covid vacine and flu shot  Followup  -r eturn Oct 2022 affter HRCT - 15 min slot fine

## 2020-08-30 DIAGNOSIS — R609 Edema, unspecified: Secondary | ICD-10-CM | POA: Diagnosis not present

## 2020-08-30 DIAGNOSIS — M25561 Pain in right knee: Secondary | ICD-10-CM | POA: Diagnosis not present

## 2020-08-30 DIAGNOSIS — M25562 Pain in left knee: Secondary | ICD-10-CM | POA: Diagnosis not present

## 2020-09-01 DIAGNOSIS — H903 Sensorineural hearing loss, bilateral: Secondary | ICD-10-CM | POA: Diagnosis not present

## 2020-09-15 DIAGNOSIS — M1711 Unilateral primary osteoarthritis, right knee: Secondary | ICD-10-CM | POA: Diagnosis not present

## 2020-09-15 DIAGNOSIS — M25561 Pain in right knee: Secondary | ICD-10-CM | POA: Diagnosis not present

## 2020-09-15 DIAGNOSIS — M25562 Pain in left knee: Secondary | ICD-10-CM | POA: Diagnosis not present

## 2020-10-05 DIAGNOSIS — Z0000 Encounter for general adult medical examination without abnormal findings: Secondary | ICD-10-CM | POA: Diagnosis not present

## 2020-10-05 DIAGNOSIS — Z1389 Encounter for screening for other disorder: Secondary | ICD-10-CM | POA: Diagnosis not present

## 2020-10-11 DIAGNOSIS — N952 Postmenopausal atrophic vaginitis: Secondary | ICD-10-CM | POA: Diagnosis not present

## 2020-10-11 DIAGNOSIS — N3020 Other chronic cystitis without hematuria: Secondary | ICD-10-CM | POA: Diagnosis not present

## 2020-10-11 DIAGNOSIS — R300 Dysuria: Secondary | ICD-10-CM | POA: Diagnosis not present

## 2020-10-17 DIAGNOSIS — Z79899 Other long term (current) drug therapy: Secondary | ICD-10-CM | POA: Diagnosis not present

## 2020-10-17 DIAGNOSIS — F430 Acute stress reaction: Secondary | ICD-10-CM | POA: Diagnosis not present

## 2020-10-17 DIAGNOSIS — L309 Dermatitis, unspecified: Secondary | ICD-10-CM | POA: Diagnosis not present

## 2020-10-17 DIAGNOSIS — R609 Edema, unspecified: Secondary | ICD-10-CM | POA: Diagnosis not present

## 2020-10-17 DIAGNOSIS — M8588 Other specified disorders of bone density and structure, other site: Secondary | ICD-10-CM | POA: Diagnosis not present

## 2020-10-17 DIAGNOSIS — K219 Gastro-esophageal reflux disease without esophagitis: Secondary | ICD-10-CM | POA: Diagnosis not present

## 2020-10-17 DIAGNOSIS — E7800 Pure hypercholesterolemia, unspecified: Secondary | ICD-10-CM | POA: Diagnosis not present

## 2020-10-17 DIAGNOSIS — E559 Vitamin D deficiency, unspecified: Secondary | ICD-10-CM | POA: Diagnosis not present

## 2020-10-17 DIAGNOSIS — M797 Fibromyalgia: Secondary | ICD-10-CM | POA: Diagnosis not present

## 2020-10-18 DIAGNOSIS — Z23 Encounter for immunization: Secondary | ICD-10-CM | POA: Diagnosis not present

## 2020-10-24 ENCOUNTER — Other Ambulatory Visit: Payer: Self-pay | Admitting: Family Medicine

## 2020-10-24 DIAGNOSIS — Z1231 Encounter for screening mammogram for malignant neoplasm of breast: Secondary | ICD-10-CM

## 2020-10-27 ENCOUNTER — Other Ambulatory Visit: Payer: Self-pay

## 2020-10-27 ENCOUNTER — Ambulatory Visit
Admission: RE | Admit: 2020-10-27 | Discharge: 2020-10-27 | Disposition: A | Discharge status: Home / Self Care | Payer: Medicare Other | Source: Ambulatory Visit | Attending: Family Medicine | Admitting: Family Medicine

## 2020-10-27 DIAGNOSIS — Z1231 Encounter for screening mammogram for malignant neoplasm of breast: Secondary | ICD-10-CM

## 2021-02-05 DIAGNOSIS — R102 Pelvic and perineal pain: Secondary | ICD-10-CM | POA: Diagnosis not present

## 2021-02-05 DIAGNOSIS — F430 Acute stress reaction: Secondary | ICD-10-CM | POA: Diagnosis not present

## 2021-06-29 DIAGNOSIS — E8770 Fluid overload, unspecified: Secondary | ICD-10-CM | POA: Diagnosis not present

## 2021-06-29 DIAGNOSIS — M797 Fibromyalgia: Secondary | ICD-10-CM | POA: Diagnosis not present

## 2021-06-29 DIAGNOSIS — K219 Gastro-esophageal reflux disease without esophagitis: Secondary | ICD-10-CM | POA: Diagnosis not present

## 2021-06-29 DIAGNOSIS — L309 Dermatitis, unspecified: Secondary | ICD-10-CM | POA: Diagnosis not present

## 2021-08-21 ENCOUNTER — Ambulatory Visit (HOSPITAL_BASED_OUTPATIENT_CLINIC_OR_DEPARTMENT_OTHER): Payer: Medicare Other

## 2021-09-02 IMAGING — MG MM DIGITAL DIAGNOSTIC UNILAT*R* W/ TOMO W/ CAD
4 series · 4 of 12 positions shown · non-contrast
Comparison: Previous exam(s).

CLINICAL DATA: 68-year-old female for further evaluation of
possible RIGHT breast asymmetry on screening mammogram.

EXAM:
DIGITAL DIAGNOSTIC UNILATERAL RIGHT MAMMOGRAM WITH CAD AND TOMO

[R MLO synth-2D]
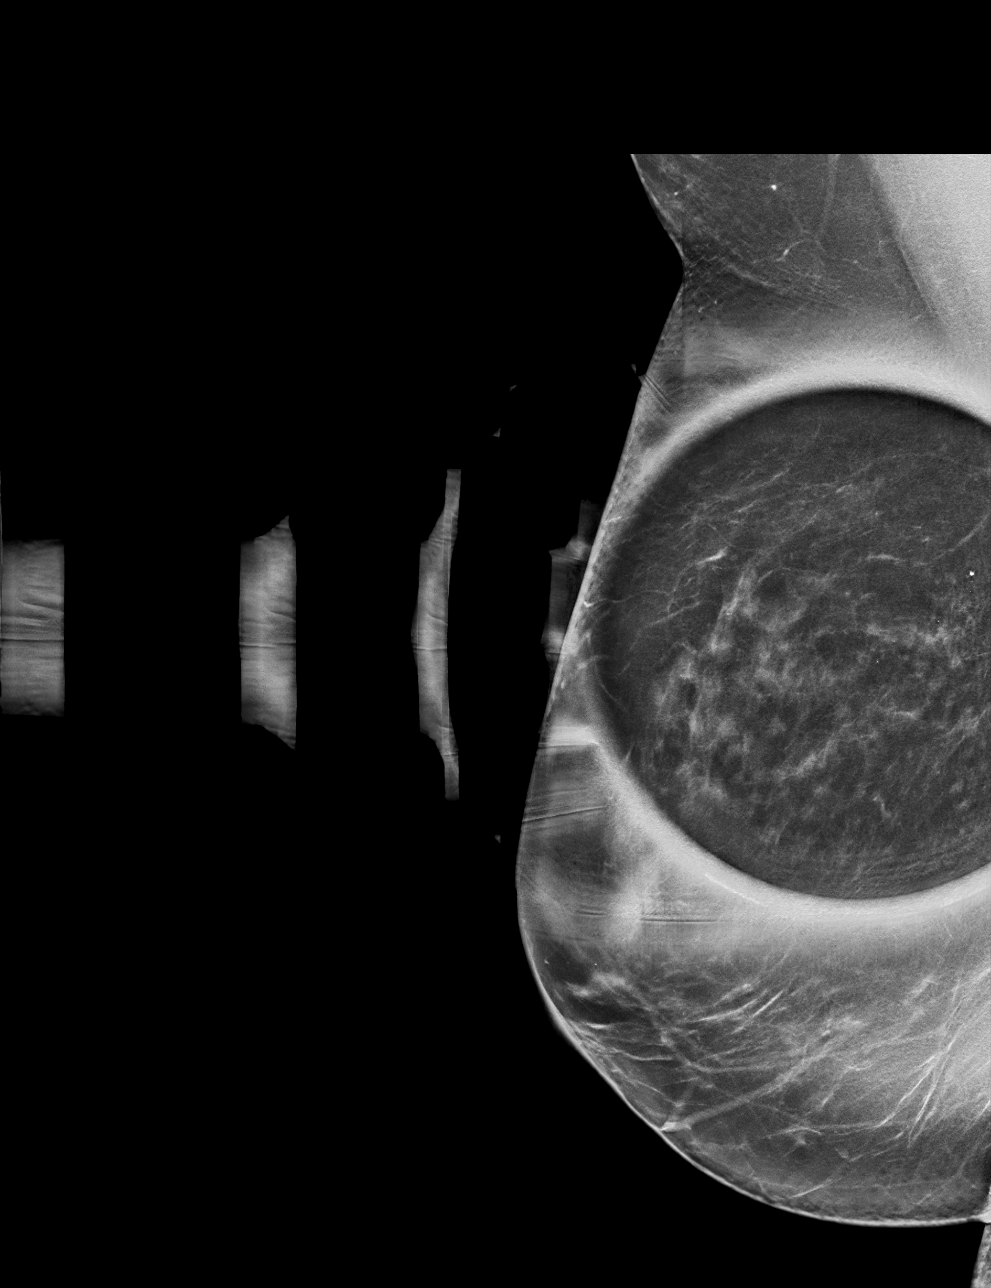

[R ML synth-2D]
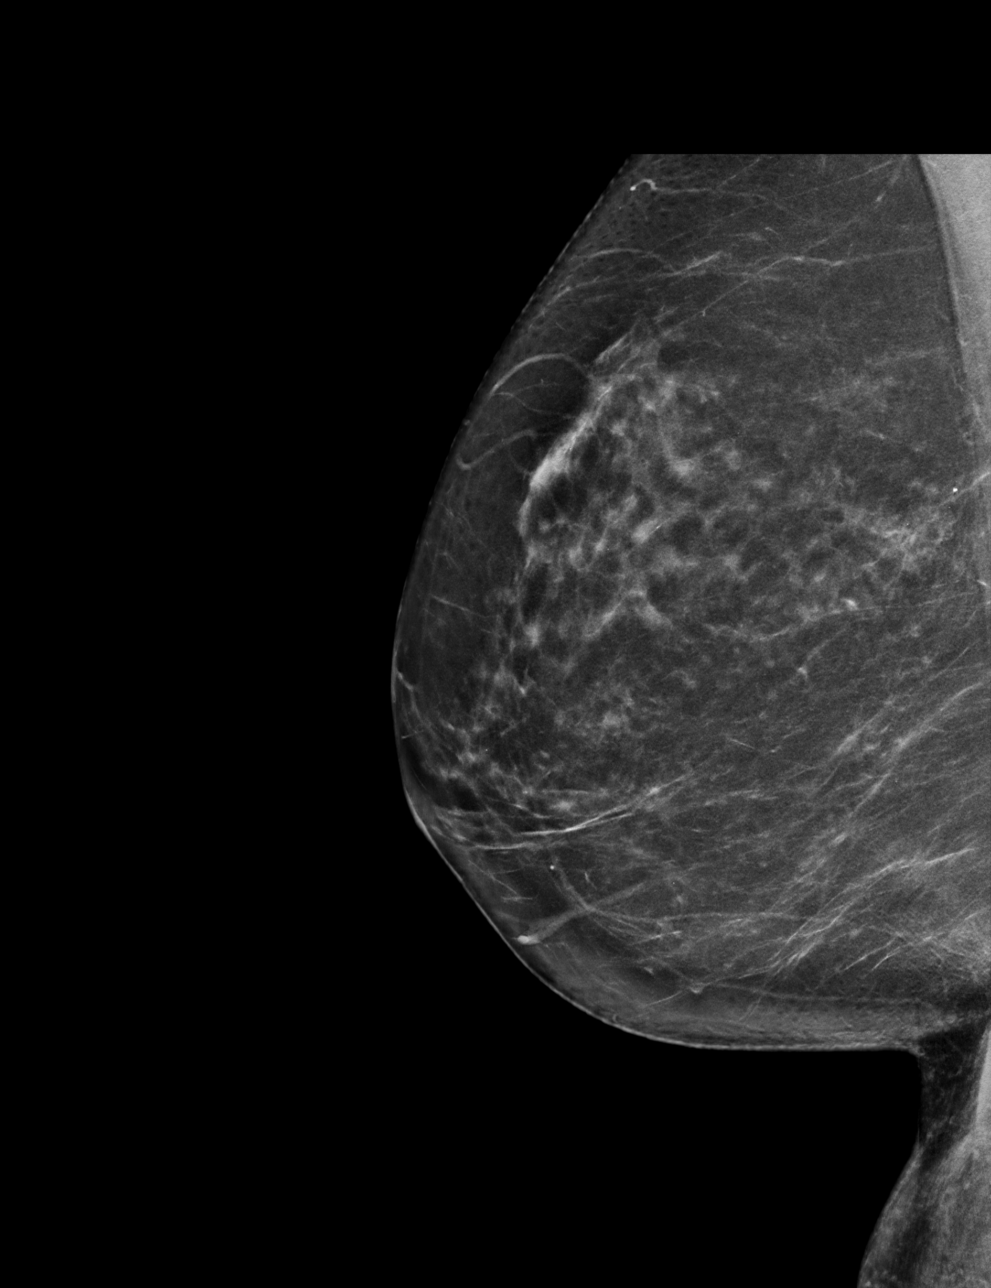

[R ML tomo · tomo slice 35/69.0]
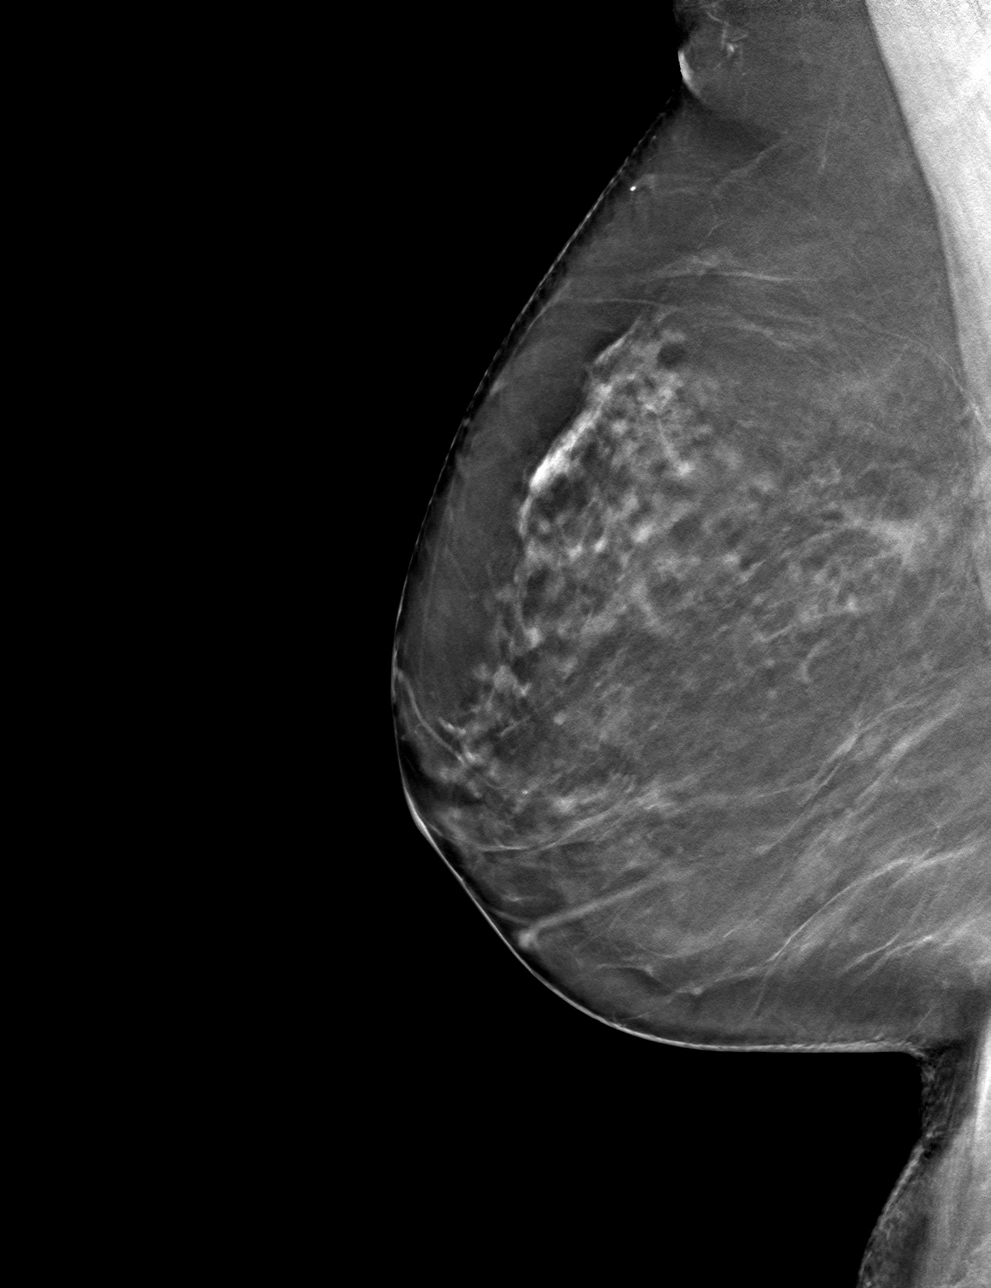

[R MLO tomo · tomo slice 33/65.0]
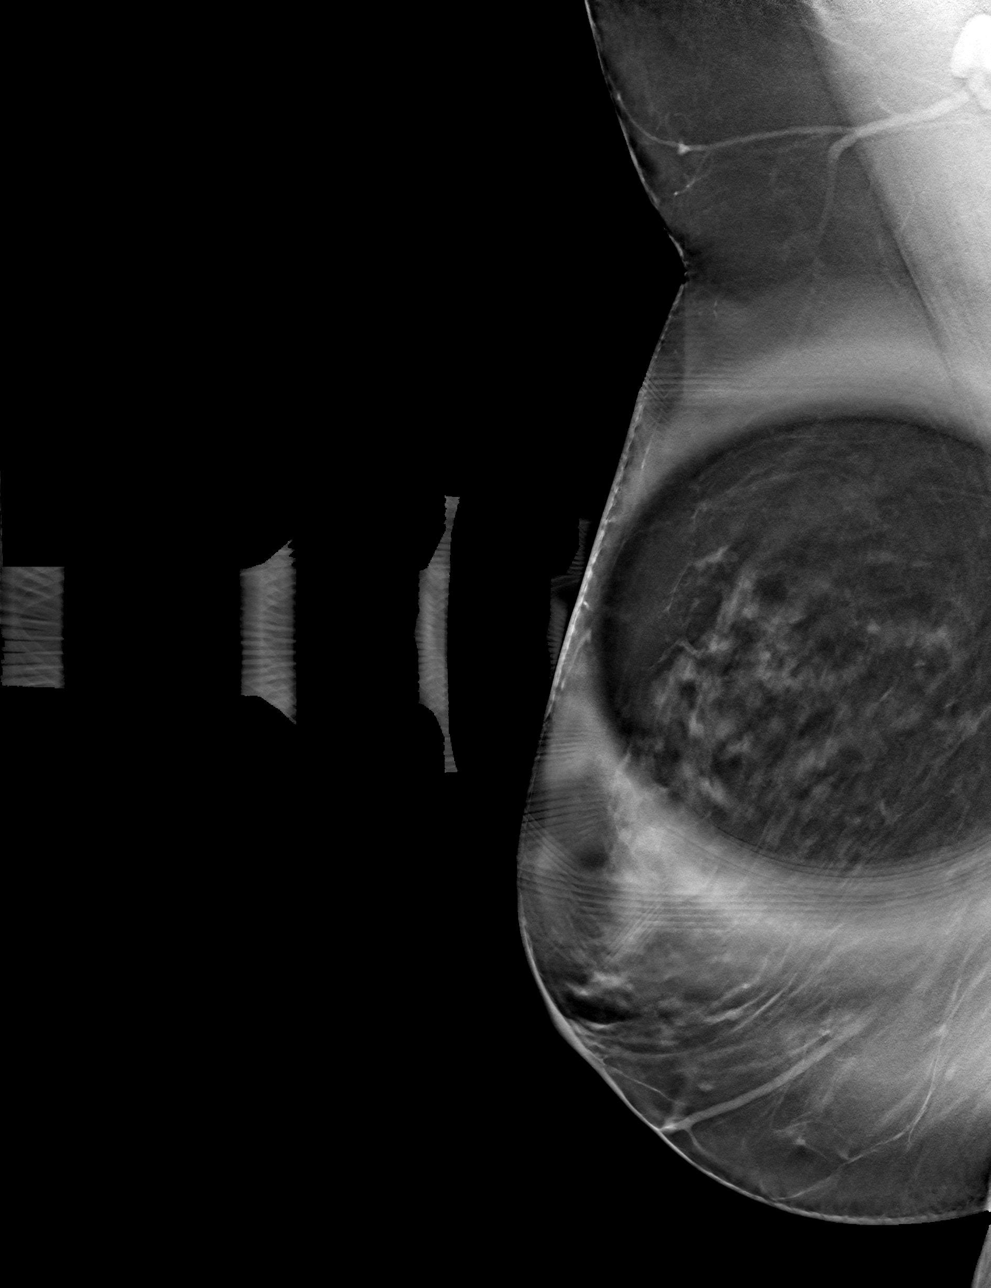

[4 of 12 positions shown; findings below may reference images not displayed]

ACR Breast Density Category b: There are scattered areas of
fibroglandular density.
FINDINGS: 2D/3D full field and spot compression views of the RIGHT breast
demonstrate effacement of the screening study asymmetry without
persistent abnormality.

Mammographic images were processed with CAD.
IMPRESSION: No persistent abnormality at the site of the screening study
finding.

RECOMMENDATION:
Bilateral screening mammogram in 1 year.

I have discussed the findings and recommendations with the patient.
If applicable, a reminder letter will be sent to the patient
regarding the next appointment.

BI-RADS CATEGORY  1: Negative.

## 2022-01-07 IMAGING — DX DG CHEST 1V PORT
1 series · 1 of 1 positions shown · non-contrast
Comparison: Radiographs 11/29/2011 and 08/22/2010.

CLINICAL DATA: Shortness of breath and weakness. [id number]
infection.

EXAM:
PORTABLE CHEST 1 VIEW

[chest ap]
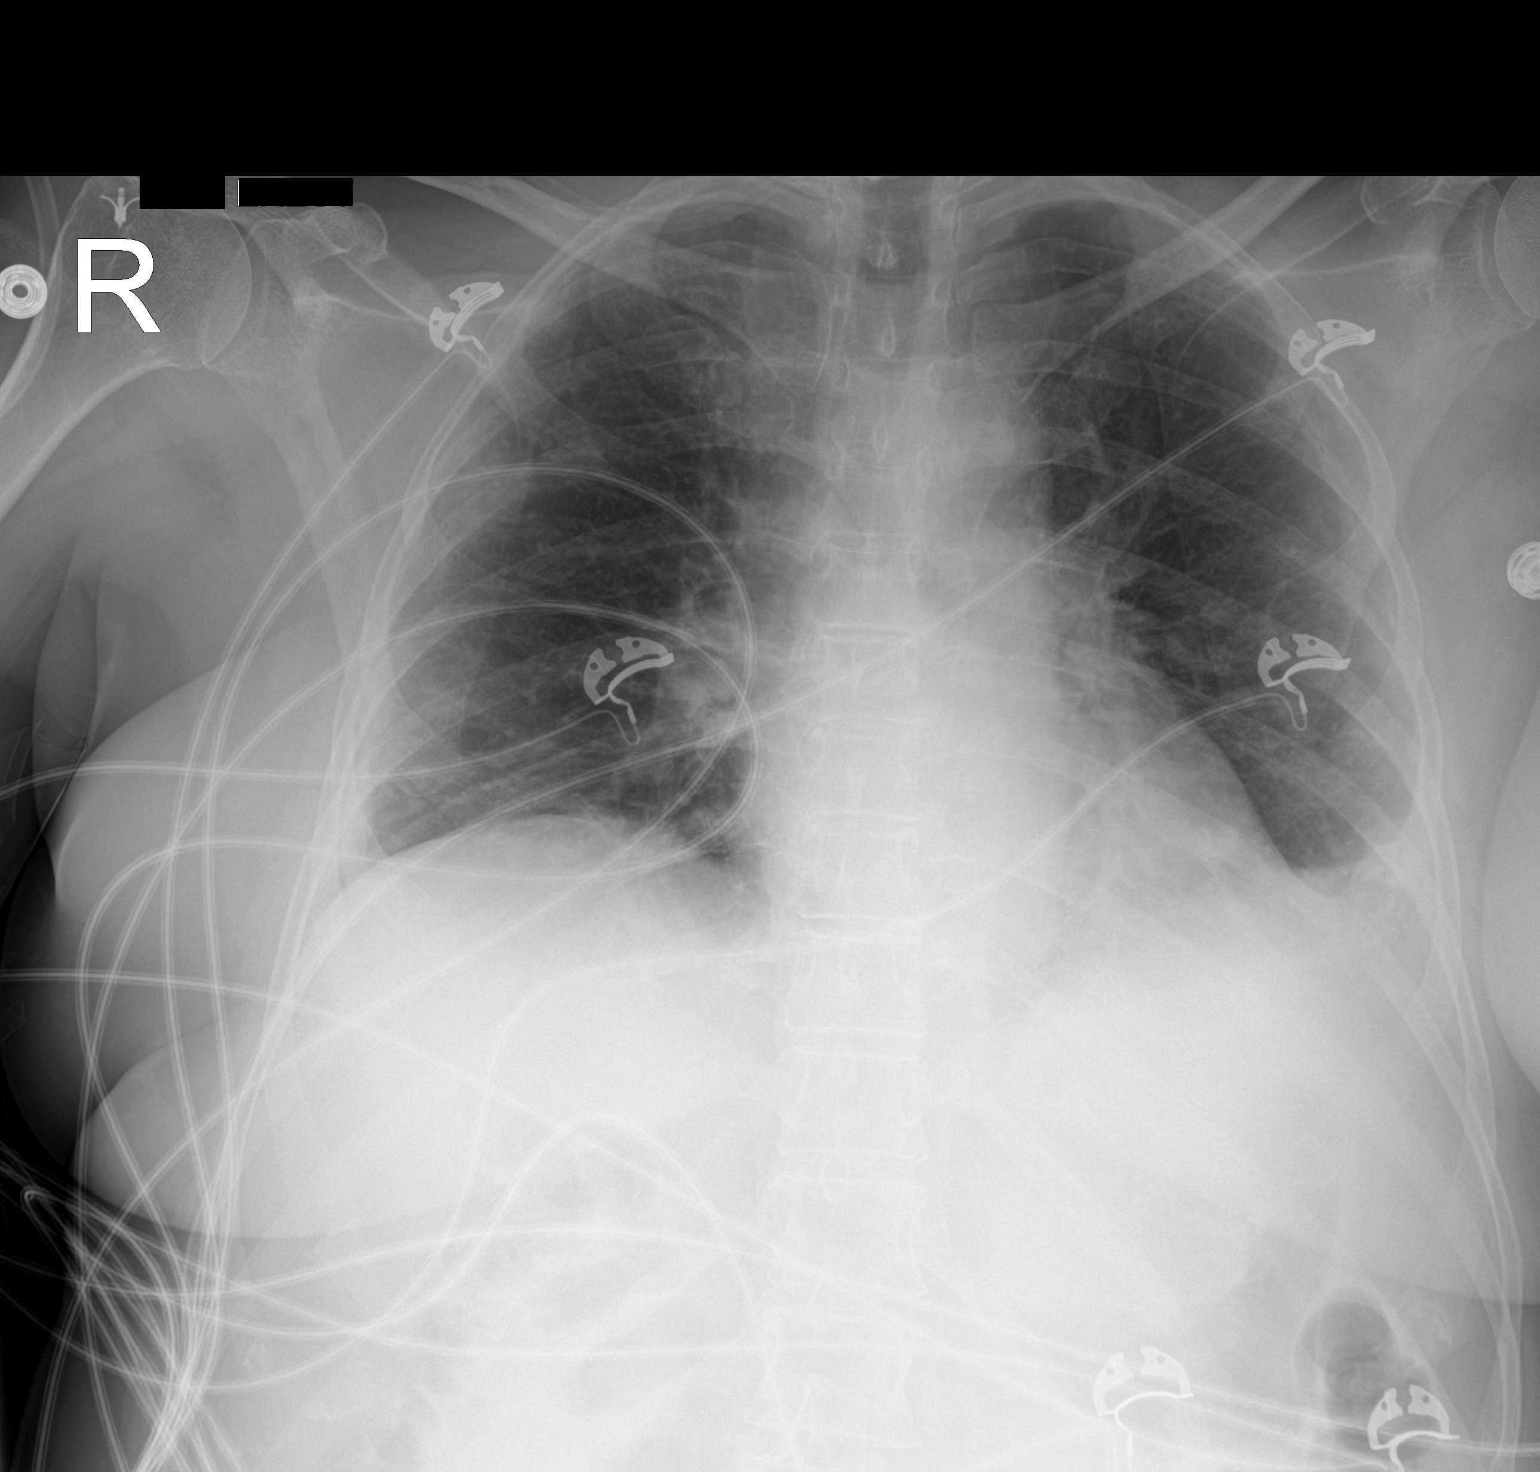

[1 of 1 positions shown; findings below may reference images not displayed]

FINDINGS: 0028 hours. Lower lung volumes. Allowing for this, the heart size
and mediastinal contours are stable. Possible minimal ground-glass
opacities peripherally in the mid right lung and at the left lung
base. Possible small left pleural effusion. No confluent airspace
opacity, edema or pneumothorax. Postsurgical changes are present at
the right shoulder. No acute osseous findings. Telemetry leads
overlie the chest.
IMPRESSION: Possible minimal ground-glass opacities peripherally in the mid
right lung and at the left lung base. Findings are nonspecific but
could reflect mild viral pneumonia. Possible small left pleural
effusion.
